# Patient Record
Sex: Female | Born: 1935 | Race: White | Hispanic: No | Marital: Married | Smoking: Never smoker
Health system: Southern US, Community
[De-identification: ages and names within clinical notes are randomized; demographics above are authoritative.]

## PROBLEM LIST (undated history)

## (undated) DIAGNOSIS — I517 Cardiomegaly: Secondary | ICD-10-CM

## (undated) DIAGNOSIS — R001 Bradycardia, unspecified: Secondary | ICD-10-CM

## (undated) DIAGNOSIS — Z9289 Personal history of other medical treatment: Secondary | ICD-10-CM

## (undated) DIAGNOSIS — I4892 Unspecified atrial flutter: Secondary | ICD-10-CM

## (undated) DIAGNOSIS — I442 Atrioventricular block, complete: Secondary | ICD-10-CM

## (undated) HISTORY — DX: Unspecified atrial flutter: I48.92

## (undated) HISTORY — DX: Bradycardia, unspecified: R00.1

## (undated) HISTORY — DX: Personal history of other medical treatment: Z92.89

## (undated) HISTORY — DX: Atrioventricular block, complete: I44.2

## (undated) HISTORY — DX: Cardiomegaly: I51.7

---

## 1992-02-07 HISTORY — PX: CHOLECYSTECTOMY: SHX55

## 1997-07-28 ENCOUNTER — Other Ambulatory Visit: Admission: RE | Admit: 1997-07-28 | Discharge: 1997-07-28 | Payer: Self-pay | Admitting: Obstetrics and Gynecology

## 1999-03-04 ENCOUNTER — Other Ambulatory Visit: Admission: RE | Admit: 1999-03-04 | Discharge: 1999-03-04 | Payer: Self-pay | Admitting: Obstetrics and Gynecology

## 2000-04-27 ENCOUNTER — Other Ambulatory Visit: Admission: RE | Admit: 2000-04-27 | Discharge: 2000-04-27 | Payer: Self-pay | Admitting: Obstetrics and Gynecology

## 2001-06-03 ENCOUNTER — Other Ambulatory Visit: Admission: RE | Admit: 2001-06-03 | Discharge: 2001-06-03 | Payer: Self-pay | Admitting: Obstetrics and Gynecology

## 2003-06-11 ENCOUNTER — Other Ambulatory Visit: Admission: RE | Admit: 2003-06-11 | Discharge: 2003-06-11 | Payer: Self-pay | Admitting: Obstetrics and Gynecology

## 2008-02-07 DIAGNOSIS — I442 Atrioventricular block, complete: Secondary | ICD-10-CM

## 2008-02-07 DIAGNOSIS — I4892 Unspecified atrial flutter: Secondary | ICD-10-CM

## 2008-02-07 DIAGNOSIS — R001 Bradycardia, unspecified: Secondary | ICD-10-CM

## 2008-02-07 HISTORY — DX: Bradycardia, unspecified: R00.1

## 2008-02-07 HISTORY — DX: Unspecified atrial flutter: I48.92

## 2008-02-07 HISTORY — DX: Atrioventricular block, complete: I44.2

## 2008-06-13 ENCOUNTER — Inpatient Hospital Stay (HOSPITAL_COMMUNITY): Admission: EM | Admit: 2008-06-13 | Discharge: 2008-06-17 | Payer: Self-pay | Admitting: Emergency Medicine

## 2008-06-13 ENCOUNTER — Encounter (INDEPENDENT_AMBULATORY_CARE_PROVIDER_SITE_OTHER): Payer: Self-pay | Admitting: Cardiology

## 2008-06-16 HISTORY — PX: PACEMAKER PLACEMENT: SHX43

## 2009-09-29 ENCOUNTER — Ambulatory Visit: Payer: Self-pay | Admitting: Cardiology

## 2010-01-08 ENCOUNTER — Encounter: Payer: Self-pay | Admitting: Internal Medicine

## 2010-03-08 NOTE — Miscellaneous (Signed)
Summary: Device preload  Clinical Lists Changes  Observations: Added new observation of PPM INDICATN: Mobitz II (01/08/2010 12:07) Added new observation of MAGNET RTE: BOL 85 ERI 65 (01/08/2010 12:07) Added new observation of PPMLEADSTAT2: active (01/08/2010 12:07) Added new observation of PPMLEADSER2: 098119  (01/08/2010 12:07) Added new observation of PPMLEADMOD2: 4470  (01/08/2010 12:07) Added new observation of PPMLEADDOI2: 06/16/2008  (01/08/2010 12:07) Added new observation of PPMLEADLOC2: RV  (01/08/2010 12:07) Added new observation of PPMLEADSTAT1: active  (01/08/2010 12:07) Added new observation of PPMLEADSER1: 147829  (01/08/2010 12:07) Added new observation of PPMLEADMOD1: 4469  (01/08/2010 12:07) Added new observation of PPMLEADDOI1: 06/16/2008  (01/08/2010 12:07) Added new observation of PPMLEADLOC1: RA  (01/08/2010 12:07) Added new observation of PPM DOI: 06/16/2008  (01/08/2010 12:07) Added new observation of PPM SERL#: [id number]  (01/08/2010 12:07) Added new observation of PPM MODL#: [id number]  (01/08/2010 12:07) Added new observation of PACEMAKERMFG: Medtronic  (01/08/2010 12:07) Added new observation of PPM IMP MD: Duffy Rhody Tennant,MD  (01/08/2010 12:07) Added new observation of PPM REFER MD: Rolla Plate  (01/08/2010 12:07) Added new observation of PACEMAKER MD: Hillis Range, MD  (01/08/2010 12:07)      PPM Specifications Following MD:  Hillis Range, MD     Referring MD:  Rolla Plate PPM Vendor:  Medtronic     PPM Model Number:  [id number]     PPM Serial Number:  [id number] PPM DOI:  06/16/2008     PPM Implanting MD:  Rolla Plate  Lead 1    Location: RA     DOI: 06/16/2008     Model #: [id number]     Serial #: [id number]     Status: active Lead 2    Location: RV     DOI: 06/16/2008     Model #: [id number]     Serial #: [id number]     Status: active  Magnet Response Rate:  BOL 85 ERI 65  Indications:  Mobitz II

## 2010-05-17 ENCOUNTER — Encounter: Payer: Self-pay | Admitting: Internal Medicine

## 2010-05-17 LAB — BASIC METABOLIC PANEL
BUN: 11 mg/dL (ref 6–23)
BUN: 12 mg/dL (ref 6–23)
CO2: 25 mEq/L (ref 19–32)
Calcium: 8.9 mg/dL (ref 8.4–10.5)
Chloride: 106 mEq/L (ref 96–112)
Creatinine, Ser: 0.9 mg/dL (ref 0.4–1.2)
Creatinine, Ser: 0.91 mg/dL (ref 0.4–1.2)
GFR calc non Af Amer: 59 mL/min — ABNORMAL LOW (ref >60)
Glucose, Bld: 108 mg/dL — ABNORMAL HIGH (ref 70–99)
Potassium: 3.8 mEq/L (ref 3.5–5.1)

## 2010-05-17 LAB — APTT: aPTT: 27 seconds (ref 24–37)

## 2010-05-17 LAB — CBC
HCT: 42 % (ref 36.0–46.0)
Hemoglobin: 14.6 g/dL (ref 12.0–15.0)
MCHC: 34.9 g/dL (ref 30.0–36.0)
MCV: 91.1 fL (ref 78.0–100.0)
MCV: 91.7 fL (ref 78.0–100.0)
Platelets: 115 10*3/uL — ABNORMAL LOW (ref 150–400)
RBC: 4.6 MIL/uL (ref 3.87–5.11)
WBC: 5.8 10*3/uL (ref 4.0–10.5)

## 2010-05-17 LAB — POCT I-STAT, CHEM 8
Creatinine, Ser: 1.1 mg/dL (ref 0.4–1.2)
Glucose, Bld: 93 mg/dL (ref 70–99)
Hemoglobin: 15 g/dL (ref 12.0–15.0)
Potassium: 3.8 mEq/L (ref 3.5–5.1)
TCO2: 25 mmol/L (ref 0–100)

## 2010-05-17 LAB — CARDIAC PANEL(CRET KIN+CKTOT+MB+TROPI)
CK, MB: 3.1 ng/mL (ref 0.3–4.0)
Total CK: 83 U/L (ref 7–177)
Troponin I: 0.19 ng/mL — ABNORMAL HIGH (ref 0.00–0.06)

## 2010-05-17 LAB — DIFFERENTIAL
Eosinophils Absolute: 0.1 10*3/uL (ref 0.0–0.7)
Eosinophils Relative: 1 % (ref 0–5)
Lymphs Abs: 1.6 10*3/uL (ref 0.7–4.0)
Monocytes Absolute: 0.5 10*3/uL (ref 0.1–1.0)
Monocytes Relative: 8 % (ref 3–12)

## 2010-05-17 LAB — CK TOTAL AND CKMB (NOT AT ARMC): Relative Index: 1.9 (ref 0.0–2.5)

## 2010-05-17 LAB — POCT CARDIAC MARKERS
CKMB, poc: <1 ng/mL — ABNORMAL LOW (ref 1.0–8.0)
Troponin i, poc: <0.05 ng/mL (ref 0.00–0.09)

## 2010-05-17 LAB — SAMPLE TO BLOOD BANK

## 2010-05-17 LAB — PROTIME-INR: Prothrombin Time: 15.4 seconds — ABNORMAL HIGH (ref 11.6–15.2)

## 2010-05-23 ENCOUNTER — Ambulatory Visit (INDEPENDENT_AMBULATORY_CARE_PROVIDER_SITE_OTHER): Payer: Medicare Other | Admitting: Internal Medicine

## 2010-05-23 ENCOUNTER — Encounter: Payer: Self-pay | Admitting: Internal Medicine

## 2010-05-23 DIAGNOSIS — I10 Essential (primary) hypertension: Secondary | ICD-10-CM | POA: Insufficient documentation

## 2010-05-23 DIAGNOSIS — I442 Atrioventricular block, complete: Secondary | ICD-10-CM | POA: Insufficient documentation

## 2010-05-23 DIAGNOSIS — I441 Atrioventricular block, second degree: Secondary | ICD-10-CM

## 2010-05-23 DIAGNOSIS — I4891 Unspecified atrial fibrillation: Secondary | ICD-10-CM | POA: Insufficient documentation

## 2010-05-23 NOTE — Progress Notes (Signed)
Toni Sanders is a pleasant 77 y.o. yo patient with a h/o bradycardia sp PPM (MDT) by Dr Deborah Chalk 06/16/08 who presents today to establish care in the Electrophysiology device clinic.  Per report, she presented with epistaxis and atrial flutter in 2010.  She was observed to have Mobitz II second degree AV block for which she underwent PPM. The patient reports doing very well since having a pacemaker implanted and remains very active despite her age. She does her own housework and remains active.  Today, she  denies symptoms of palpitations, chest pain, shortness of breath, orthopnea, PND, lower extremity edema, dizziness, presyncope, syncope, or neurologic sequela.  The patientis tolerating medications without difficulties and is otherwise without complaint today.   Past Medical History  Diagnosis Date  . Atrial flutter 2010  . Bradycardia 2010  . Complete heart block 2010    s/p PPM by Dr Deborah Chalk (MDT) 06/16/08  . Osteoporosis     Past Surgical History  Procedure Date  . Pacemaker placement 06/16/08    Implantation of dual-chamber Medtronic PPMY by Dr Deborah Chalk    History   Social History  . Marital Status: Married    Spouse Name: N/A    Number of Children: N/A  . Years of Education: N/A   Occupational History  . Not on file.   Social History Main Topics  . Smoking status: Never Smoker   . Smokeless tobacco: Never Used  . Alcohol Use: No  . Drug Use: No  . Sexually Active: Not on file   Other Topics Concern  . Not on file   Social History Narrative   Lives with spouse in [place].    Family History  Problem Relation Age of Onset  . Hypertension      Allergies  Allergen Reactions  . Morphine And Related Nausea And Vomiting    Current outpatient prescriptions:aspirin 81 MG tablet, 1 tab when pt remebers , Disp: , Rfl: ;  fish oil-omega-3 fatty acids 1000 MG capsule, 1 tab po qd , Disp: , Rfl: ;  metoprolol (TOPROL-XL) 50 MG 24 hr tablet, Take 50 mg by mouth daily.  ,  Disp: , Rfl:   ROS- all systems are reviewed and negative except as per HPI  Physical Exam: Filed Vitals:   05/23/10 1128  BP: 152/68  Pulse: 68  Resp: 14  Height: 5' (1.524 m)  Weight: 142 lb (64.411 kg)    GEN- The patient is well appearing, alert and oriented x 3 today.   Head- normocephalic, atraumatic Eyes-  Sclera clear, conjunctiva pink Ears- hearing intact Oropharynx- clear Neck- supple, no JVP Lymph- no cervical lymphadenopathy Lungs- Clear to ausculation bilaterally, normal work of breathing Chest- pacemaker pocket is well healed Heart- Regular rate and rhythm, no murmurs, rubs or gallops, PMI not laterally displaced GI- soft, NT, ND, + BS Extremities- no clubbing, cyanosis, or edema MS- no significant deformity or atrophy Skin- no rash or lesion Psych- euthymic mood, full affect Neuro- strength and sensation are intact

## 2010-05-23 NOTE — Assessment & Plan Note (Signed)
Normal pacemaker function See Pace Art report No changes today  

## 2010-05-23 NOTE — Assessment & Plan Note (Signed)
BP above goal today Salt restriction advised No medicine changes

## 2010-05-23 NOTE — Patient Instructions (Signed)
Your physician recommends that you schedule a follow-up appointment in: YEAR WITH DR [facility] Your physician recommends that you continue on your current medications as directed. Please refer to the Current Medication list given to you today.

## 2010-06-21 NOTE — Cardiovascular Report (Signed)
[name], Toni Sanders NO.:  [id number]   MEDICAL RECORD NO.:  [id number]          PATIENT TYPE:  INP   LOCATION:  2017                         FACILITY:  [facility]   PHYSICIAN:  Colleen Can. Deborah Chalk, M.D.DATE OF BIRTH:  1922/12/06   DATE OF PROCEDURE:  06/16/2008  DATE OF DISCHARGE:                            CARDIAC CATHETERIZATION   PROCEDURE:  Implantation of dual-chamber pulse generator under  fluoroscopy.   INDICATIONS FOR PROCEDURE:  Second-degree arteriovenous block, but also  associated atrial flutter with profound bradycardia.   PROCEDURE:  The right subclavicular area was prepped and draped.  The  area was infiltrated with 1% Xylocaine.  Subcutaneous pocket was created  to the prepectoral fascia.  Two punctures were made into the subclavian  vein over top of the first rib.  Using 7-French [facility] introducers, the  atrial and ventricular leads were introduced.  The ventricular lead was  a Guidant model [id number], serial number [id number], 52-cm lead was placed the  right ventricular apex.  The R waves measured 8.6 mV.  Right ventricular  lead impedance was 690 ohms.  The right ventricular threshold was 0.4 V  at 0.5 msec pulse width with a current of 0.5 MA.  There was no  diaphragmatic pacing at 10 V.   The atrial lead was introduced with 7-French [facility] introducer.  The lead  was a Guidant model [id number], serial number [id number], 45-cm lead was placed in  the right atrium.  P-waves measured 3.3 mV.  The right atrial lead  impedance was 476 ohms.  Right atrial threshold was 0.4 V at 0.5 msec  with a current of 1.5 MA.  The leads were sutured in place.  The wound  was flushed with gentamicin solution.  The leads were connected to  Medtronic ENRHYTHM model 31501DR, serial number PNP [id number].  The unit  was sutured in place.  The wound was then closed with 2-0 and  subsequently 4-0 Vicryl.  Steri-Strips were applied.  The patient  tolerated the procedure well.      Colleen Can.  Deborah Chalk, M.D.  Electronically Signed     SNT/MEDQ  D:  06/16/2008  T:  06/17/2008  Job:  [id number]   cc:   Colleen Can. Deborah Chalk, M.D.

## 2010-06-21 NOTE — Discharge Summary (Signed)
Toni Sanders, Toni Sanders NO.:  [id number]   MEDICAL RECORD NO.:  [id number]          PATIENT TYPE:  INP   LOCATION:  2017                         FACILITY:  [facility]   PHYSICIAN:  Colleen Can. Deborah Chalk, M.D.DATE OF BIRTH:  July 12, 1922   DATE OF ADMISSION:  06/13/2008  DATE OF DISCHARGE:  06/17/2008                               DISCHARGE SUMMARY   DISCHARGE DIAGNOSES:  1. Third-degree arteriovenous block with subsequent implantation of a      Medtronic EnRhythm P1501DR, serial number [id number].  2. Transient atrial fibrillation/flutter.  3. Hypertension, now started on beta-blocker therapy.  4. History of seasonal allergies.   HISTORY OF PRESENT ILLNESS:  The patient is an 77 year old white female  who has really had no significant cardiac history.  She has had  borderline hypertension and has not been on medicines.  She presented to  the hospital with recurrent epistaxis.  In the emergency room, she was  found to be significantly hypertensive as well as profoundly  bradycardic.  She was noted to be in complete heart block.  She was  subsequently seen and admitted for further evaluation.   Please see the history and physical for further patient presentation and  profile.   LABORATORY DATA ON ADMISSION:  Her chest x-ray showed cardiomegaly  without acute disease.  CBC showed hemoglobin of 14, hematocrit 42,  white count was 5, and platelets 150.  Chemistries were normal except  for glucose of 116.  Coags were normal.  Cardiac enzymes were basically  negative.  She had a mild elevation in the troponin at 0.34 and 0.19.  Her CK-MBs were all negative.  Her TSH was normal.   HOSPITAL COURSE:  The patient was admitted to the coronary care unit.  External pacer pads were placed.  She was totally asymptomatic from her  rhythm standpoint.  Her blood pressure was treated with IV  nitroglycerin.  She did develop transient atrial fibrillation/flutter on  Jun 16, 2008.  We were  able to proceed on with pacemaker implantation  that following day.  The procedure was tolerated well without any no  known complications.  An EnRhythm P1501DR, serial number [id number] was  placed.  An overall satisfactory result was obtained.  Today, on Jun 17, 2008, she is doing well without complaints.  Her pacemaker site is  satisfactory.  Her telemetry is satisfactory.  Her blood pressure  remains elevated and we will go ahead and start low-dose beta-blocker  therapy.  She does have some worsening aeration on her chest x-ray in  the left base, this will need to be repeated as an outpatient.  From our  standpoint, she is felt to be stable for discharge.   DISCHARGE CONDITION:  Stable.   DISCHARGE DIET:  Low-salt, heart-healthy.   DISCHARGE MEDICINES:  1. Fish oil tablet daily as she was taking before.  2. We will add baby aspirin daily and Toprol XL 50 mg a day.  3. She can take Tylenol for pain.   Extensive written instructions are given regarding pacemaker care,  specifically not to raise her right arm above  her head for the next 2-3  weeks as well as to avoid getting the wound wet for the next 5 days.  We  will plan on seeing her back in the office in 1 week, certainly sooner  if any problems arise in the interim.   Greater than 30 minutes spent for discharge.      [name], N.P.      Colleen Can. Deborah Chalk, M.D.  Electronically Signed    LC/MEDQ  D:  06/17/2008  T:  06/17/2008  Job:  [id number]   cc:   Geoffry Paradise, M.D.

## 2010-08-18 ENCOUNTER — Encounter: Payer: Medicare Other | Admitting: *Deleted

## 2010-08-25 ENCOUNTER — Encounter: Payer: Self-pay | Admitting: *Deleted

## 2010-08-30 ENCOUNTER — Telehealth: Payer: Self-pay | Admitting: Internal Medicine

## 2010-08-30 NOTE — Telephone Encounter (Signed)
All Cardiac Records faxed to [facility], ROI signed by PT  08/30/10/km

## 2010-09-22 DIAGNOSIS — I517 Cardiomegaly: Secondary | ICD-10-CM

## 2010-09-22 DIAGNOSIS — Z9289 Personal history of other medical treatment: Secondary | ICD-10-CM

## 2010-09-22 HISTORY — DX: Cardiomegaly: I51.7

## 2010-09-22 HISTORY — DX: Personal history of other medical treatment: Z92.89

## 2012-08-18 ENCOUNTER — Other Ambulatory Visit: Payer: Self-pay | Admitting: Cardiovascular Disease

## 2012-08-18 DIAGNOSIS — I441 Atrioventricular block, second degree: Secondary | ICD-10-CM

## 2012-09-05 ENCOUNTER — Other Ambulatory Visit: Payer: Self-pay | Admitting: Cardiovascular Disease

## 2012-09-05 ENCOUNTER — Telehealth: Payer: Self-pay | Admitting: *Deleted

## 2012-09-05 ENCOUNTER — Encounter (HOSPITAL_COMMUNITY): Payer: Self-pay | Admitting: Pharmacy Technician

## 2012-09-05 ENCOUNTER — Other Ambulatory Visit: Payer: Self-pay | Admitting: *Deleted

## 2012-09-05 DIAGNOSIS — Z45018 Encounter for adjustment and management of other part of cardiac pacemaker: Secondary | ICD-10-CM

## 2012-09-05 LAB — REMOTE PACEMAKER DEVICE
AL AMPLITUDE: 1.8 mv
BRDY-0002RA: 65 {beats}/min

## 2012-09-05 NOTE — Telephone Encounter (Signed)
EnRhythm pacemaker has reached ERI after 4 years of service. Patient made aware. No symptoms associated with VVI/65. Patient prefers to make an appointment with Dr.Croitoru prior to scheduling the procedure---scheduler notified. MDT rep will also be notified of the early batt depletion.

## 2012-09-05 NOTE — Telephone Encounter (Signed)
After further review of the patient's chart, I discovered that she is dependent and that we have had issues with her device before, so I explained to the patient that it would be more beneficial for her to have the procedure tomorrow. Patient voiced understanding and agreed.

## 2012-09-06 ENCOUNTER — Ambulatory Visit (HOSPITAL_COMMUNITY)
Admission: RE | Admit: 2012-09-06 | Discharge: 2012-09-06 | Disposition: A | Discharge status: Home / Self Care | Payer: Medicare Other | Source: Ambulatory Visit | Attending: Cardiovascular Disease | Admitting: Cardiovascular Disease

## 2012-09-06 ENCOUNTER — Encounter (HOSPITAL_COMMUNITY): Payer: Self-pay | Admitting: Cardiology

## 2012-09-06 ENCOUNTER — Encounter (HOSPITAL_COMMUNITY)
Admission: RE | Disposition: A | Discharge status: Home / Self Care | Payer: Self-pay | Source: Ambulatory Visit | Attending: Cardiovascular Disease

## 2012-09-06 ENCOUNTER — Encounter (HOSPITAL_COMMUNITY): Payer: Self-pay | Admitting: Pharmacy Technician

## 2012-09-06 DIAGNOSIS — C4490 Unspecified malignant neoplasm of skin, unspecified: Secondary | ICD-10-CM | POA: Diagnosis present

## 2012-09-06 DIAGNOSIS — I10 Essential (primary) hypertension: Secondary | ICD-10-CM | POA: Insufficient documentation

## 2012-09-06 DIAGNOSIS — I4891 Unspecified atrial fibrillation: Secondary | ICD-10-CM | POA: Diagnosis present

## 2012-09-06 DIAGNOSIS — Z45018 Encounter for adjustment and management of other part of cardiac pacemaker: Secondary | ICD-10-CM | POA: Insufficient documentation

## 2012-09-06 DIAGNOSIS — M810 Age-related osteoporosis without current pathological fracture: Secondary | ICD-10-CM | POA: Insufficient documentation

## 2012-09-06 DIAGNOSIS — Z884 Allergy status to anesthetic agent status: Secondary | ICD-10-CM | POA: Insufficient documentation

## 2012-09-06 DIAGNOSIS — Z950 Presence of cardiac pacemaker: Secondary | ICD-10-CM

## 2012-09-06 DIAGNOSIS — Z45010 Encounter for checking and testing of cardiac pacemaker pulse generator [battery]: Secondary | ICD-10-CM

## 2012-09-06 DIAGNOSIS — I4892 Unspecified atrial flutter: Secondary | ICD-10-CM | POA: Insufficient documentation

## 2012-09-06 DIAGNOSIS — I442 Atrioventricular block, complete: Secondary | ICD-10-CM

## 2012-09-06 DIAGNOSIS — Z885 Allergy status to narcotic agent status: Secondary | ICD-10-CM | POA: Insufficient documentation

## 2012-09-06 DIAGNOSIS — Z85828 Personal history of other malignant neoplasm of skin: Secondary | ICD-10-CM | POA: Insufficient documentation

## 2012-09-06 HISTORY — PX: PACEMAKER GENERATOR CHANGE: SHX5481

## 2012-09-06 HISTORY — PX: PACEMAKER GENERATOR CHANGE: SHX5998

## 2012-09-06 LAB — CBC
MCH: 32.2 pg (ref 26.0–34.0)
MCV: 92.4 fL (ref 78.0–100.0)
Platelets: 140 10*3/uL — ABNORMAL LOW (ref 150–400)
RDW: 13.2 % (ref 11.5–15.5)

## 2012-09-06 LAB — BASIC METABOLIC PANEL
CO2: 25 mEq/L (ref 19–32)
Calcium: 9.3 mg/dL (ref 8.4–10.5)
Creatinine, Ser: 0.93 mg/dL (ref 0.50–1.10)
GFR calc Af Amer: 62 mL/min — ABNORMAL LOW (ref >90)

## 2012-09-06 LAB — SURGICAL PCR SCREEN
MRSA, PCR: NEGATIVE
Staphylococcus aureus: NEGATIVE

## 2012-09-06 LAB — PROTIME-INR: Prothrombin Time: 12.8 seconds (ref 11.6–15.2)

## 2012-09-06 SURGERY — PACEMAKER GENERATOR CHANGE
Anesthesia: LOCAL

## 2012-09-06 MED ORDER — MIDAZOLAM HCL 5 MG/5ML IJ SOLN
INTRAMUSCULAR | Status: AC
  Filled 2012-09-06: qty 5

## 2012-09-06 MED ORDER — CEFAZOLIN SODIUM-DEXTROSE 2-3 GM-% IV SOLR
2.0000 g | INTRAVENOUS | Status: DC
  Filled 2012-09-06: qty 50

## 2012-09-06 MED ORDER — SODIUM CHLORIDE 0.9 % IV SOLN
INTRAVENOUS | Status: DC
  Administered 2012-09-06: 08:00:00 via INTRAVENOUS

## 2012-09-06 MED ORDER — ONDANSETRON HCL 4 MG/2ML IJ SOLN: 4.0000 mg | Freq: Four times a day (QID) | INTRAMUSCULAR | Status: DC | PRN

## 2012-09-06 MED ORDER — CHLORHEXIDINE GLUCONATE 4 % EX LIQD
60.0000 mL | Freq: Once | CUTANEOUS | Status: DC
  Filled 2012-09-06: qty 60

## 2012-09-06 MED ORDER — SODIUM CHLORIDE 0.9 % IJ SOLN: 3.0000 mL | INTRAMUSCULAR | Status: DC | PRN

## 2012-09-06 MED ORDER — ACETAMINOPHEN 325 MG PO TABS: 325.0000 mg | ORAL_TABLET | ORAL | Status: DC | PRN

## 2012-09-06 MED ORDER — ONDANSETRON HCL 4 MG/2ML IJ SOLN
INTRAMUSCULAR | Status: AC
  Filled 2012-09-06: qty 2

## 2012-09-06 MED ORDER — GENTAMICIN SULFATE 40 MG/ML IJ SOLN
80.0000 mg | INTRAMUSCULAR | Status: DC
  Filled 2012-09-06: qty 2

## 2012-09-06 MED ORDER — LIDOCAINE HCL (PF) 1 % IJ SOLN
INTRAMUSCULAR | Status: AC
  Filled 2012-09-06: qty 60

## 2012-09-06 MED ORDER — SODIUM CHLORIDE 0.9 % IV SOLN: INTRAVENOUS | Status: DC

## 2012-09-06 MED ORDER — MUPIROCIN 2 % EX OINT
TOPICAL_OINTMENT | Freq: Two times a day (BID) | CUTANEOUS | Status: DC
  Administered 2012-09-06: 1 via NASAL
  Filled 2012-09-06 (×2): qty 22

## 2012-09-06 MED ORDER — FENTANYL CITRATE 0.05 MG/ML IJ SOLN
INTRAMUSCULAR | Status: AC
  Filled 2012-09-06: qty 2

## 2012-09-06 NOTE — Op Note (Signed)
Procedure report  Procedure performed:  1. Dual chamber pacemaker generator changeout  2. Light sedation  Reason for procedure:  1. Device generator at elective replacement interval  - early battery depletion .Complete heart block, pacemaker dependent Procedure performed by:  Thurmon Fair, MD  Complications:  None  Estimated blood loss:  <5 mL  Medications administered during procedure:  Ancef 2 g intravenously, lidocaine 1% 30 mL locally, fentanyl 25 mcg intravenously, Versed 1 mg intravenously Device details:   New Generator Medtronic Adapta L model number ADDRL1, serial number [id number] Right atrial lead (chronic) Guidant [id number], serial number [id number] (implanted 06/16/2008) Right ventricular lead (chronic)  Guidant 4470, serial number [id number] (implanted 06/16/2008)  Explanted generator Medtronic EnRhythm,  model number [id number], serial number   (implanted 06/16/2008)  Procedure details:  After the risks and benefits of the procedure were discussed the patient provided informed consent. She was brought to the cardiac catheter lab in the fasting state. The patient was prepped and draped in usual sterile fashion. Local anesthesia with 1% lidocaine was administered to to the left infraclavicular area. A 5-6cm horizontal incision was made parallel with and 2-3 cm caudal to the right clavicle, in the area of an old scar. Using minimal electrocautery and mostly sharp and blunt dissection the prepectoral pocket was opened carefully to avoid injury to the loops of chronic leads. Extensive dissection was not necessary. The device was explanted. The pocket was carefully inspected for hemostasis and flushed with copious amounts of antibiotic solution.  The leads were disconnected from the old generator and testing of the lead parameters showed excellent values. The new generator was connected to the chronic leads, with appropriate pacing noted.   The entire system was then carefully inserted in the  pocket with care been taking that the leads and device assumed a comfortable position without pressure on the incision. Great care was taken that the leads be located deep to the generator. The pocket was then closed in layers using 2 layers of 2-0 Vicryl and cutaneous staples after which a sterile dressing was applied.   At the end of the procedure the following lead parameters were encountered:   Right atrial lead sensed P waves 3.6 mV, impedance 542 ohms, threshold 0.7 at 0.4 ms pulse width.  Right ventricular lead sensed R waves  None detected, impedance 448 ohms, threshold 0.5 at 0.4 ms pulse width. Thurmon Fair, MD, [facility] [phone] office [phone] pager

## 2012-09-06 NOTE — H&P (Signed)
Patient ID: Toni Sanders MRN: [id number], DOB/AGE: 1922/02/24    Admit date: 09/06/2012     Primary Physician: Minda Meo, MD Primary Cardiologist: Dr Royann Shivers   HPI: 79 y/o followed by Dr Royann Shivers and Dr Bryn Gulling with a history of prior pacemaker implant by Dr Deborah Chalk in May 2010 for HB. She is pace dependent. Recent remote check indicated she was at end of battery life and she is admitted now for generator change. She is asymptomatic, no syncope, weakness, or unusual dyspnea.  Problem List: Past Medical History   Diagnosis  Date   .  Atrial flutter  2010   .  Bradycardia  2010   .  Complete heart block  2010       s/p PPM by Dr Deborah Chalk (MDT) 06/16/08   .  Osteoporosis      Past Surgical History   Procedure  Laterality  Date   .  Pacemaker placement    06/16/08       Implantation of dual-chamber Medtronic PPMY by Dr Deborah Chalk   .  Cholecystectomy    1994     Allergies:  Allergies   Allergen  Reactions   .  Anesthetics, Amide         "hard to come out of"   .  Anesthetics, Ester     .  Anesthetics, Halogenated     .  Morphine And Related  Nausea And Vomiting     Home Medications Current Facility-Administered Medications   Medication  Dose  Route  Frequency  Provider  Last Rate  Last Dose   .  0.9 %  sodium chloride infusion     Intravenous  Continuous  Marynell Bies, MD         .  ceFAZolin (ANCEF) IVPB 2 g/50 mL premix   2 g  Intravenous  On Call  Kimbrely Buckel, MD         .  chlorhexidine (HIBICLENS) 4 % liquid 4 application   60 mL  Topical  Once  Chikita Dogan, MD         .  gentamicin (GARAMYCIN) 80 mg in sodium chloride irrigation 0.9 % 500 mL irrigation   80 mg  Irrigation  On Call  Thurmon Fair, MD         .  mupirocin ointment (BACTROBAN) 2 %     Nasal  BID  Thurmon Fair, MD     1 application at 09/06/12 0754   .  sodium chloride 0.9 % injection 3 mL   3 mL  Intravenous  PRN  Thurmon Fair, MD            Family History   Problem  Relation  Age  of Onset   .  Hypertension          History      Social History   .  Marital Status:  Married       Spouse Name:  N/A       Number of Children:  N/A   .  Years of Education:  N/A       Occupational History   .  Not on file.       Social History Main Topics   .  Smoking status:  Never Smoker    .  Smokeless tobacco:  Never Used   .  Alcohol Use:  No   .  Drug Use:  No   .  Sexually Active:  Not on file  Other Topics  Concern   .  Not on file       Social History Narrative     Lives with spouse in [place].    Review of Systems: General: negative for chills, fever, night sweats or weight changes.   Cardiovascular: negative for chest pain, dyspnea on exertion, edema, orthopnea, palpitations, paroxysmal nocturnal dyspnea or shortness of breath. Low risk Myoview 2012. Nl LVF by echo 2012. Dermatological: negative for rash. 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 She had recent skin biopsy on her nose. Respiratory: negative for cough or wheezing Urologic: negative for hematuria Abdominal: negative for nausea, vomiting, diarrhea, bright red blood per rectum, melena, or hematemesis Neurologic: negative for visual changes, syncope, or dizziness. No history of CVA All other systems reviewed and are otherwise negative except as noted above.   Physical Exam: Blood pressure 177/70, temperature 98.1 F (36.7 C), temperature source Oral, resp. rate 18, height 5\' 2"  (1.575 m), weight 137 lb (62.143 kg), SpO2 99.00%.  General appearance: alert, cooperative, appears stated age and no distress Recent skin biopsy on nose Neck: no carotid bruit and no JVD Lungs: clear to auscultation bilaterally Heart: regular rate and rhythm Abdomen: soft, non-tender; bowel sounds normal; no masses,  no organomegaly Extremities: extremities normal, atraumatic, no cyanosis or edema Pulses: 2+ and symmetric Skin: Skin color, texture, turgor normal. No rashes or  lesions or recent skin biopsy on her nose Neurologic: Grossly normal  Labs:  No results found for this or any previous visit (from the past 24 hour(s)).   Radiology/Studies: No results found.   ZHY:QMVHQ   ASSESSMENT AND PLAN:   Principal Problem:   Pacemaker battery depletion Active Problems:   Atrial flutter   Complete heart block-MDT PTVDP 5/10. Pacer dependent   Hypertension   Skin cancer- squamous cell, basal cell  PLAN: Elective generator change.  Unexpected early battery depletion in a pacemaker generator with known advisory and with a previous unexplained software reset a few months ago. The device function is not reliable and the patient has complete heart block. Plan generator changeout today. Suspect lead function is normal and lead revision is unlikely to be needed.   This procedure has been fully reviewed with the patient and written informed consent has been obtained.   Thurmon Fair, MD, [facility] [phone] 09/06/2012, 8:28 AM

## 2012-09-06 NOTE — Progress Notes (Signed)
Patient ID: Toni Sanders MRN: [id number], DOB/AGE: 1922-04-07   Admit date: 09/06/2012   Primary Physician: Minda Meo, MD Primary Cardiologist: Dr Royann Shivers  HPI: 79 y/o followed by Dr Royann Shivers and Dr Bryn Gulling with a history of prior pacemaker implant by Dr Deborah Chalk in May 2010 for HB. She is pace dependent. Recent remote check indicated she was at end of battery life and she is admitted now for generator change. She is asymptomatic, no syncope, weakness, or unusual dyspnea.   Problem List: Past Medical History  Diagnosis Date  . Atrial flutter 2010  . Bradycardia 2010  . Complete heart block 2010    s/p PPM by Dr Deborah Chalk (MDT) 06/16/08  . Osteoporosis     Past Surgical History  Procedure Laterality Date  . Pacemaker placement  06/16/08    Implantation of dual-chamber Medtronic PPMY by Dr Deborah Chalk  . Cholecystectomy  1994     Allergies:  Allergies  Allergen Reactions  . Anesthetics, Amide     "hard to come out of"  . Anesthetics, Ester   . Anesthetics, Halogenated   . Morphine And Related Nausea And Vomiting     Home Medications Current Facility-Administered Medications  Medication Dose Route Frequency Provider Last Rate Last Dose  . 0.9 %  sodium chloride infusion   Intravenous Continuous Mihai Croitoru, MD      . ceFAZolin (ANCEF) IVPB 2 g/50 mL premix  2 g Intravenous On Call Mihai Croitoru, MD      . chlorhexidine (HIBICLENS) 4 % liquid 4 application  60 mL Topical Once Mihai Croitoru, MD      . gentamicin (GARAMYCIN) 80 mg in sodium chloride irrigation 0.9 % 500 mL irrigation  80 mg Irrigation On Call Thurmon Fair, MD      . mupirocin ointment (BACTROBAN) 2 %   Nasal BID Thurmon Fair, MD   1 application at 09/06/12 0754  . sodium chloride 0.9 % injection 3 mL  3 mL Intravenous PRN Thurmon Fair, MD         Family History  Problem Relation Age of Onset  . Hypertension       History   Social History  . Marital Status: Married    Spouse Name: N/A     Number of Children: N/A  . Years of Education: N/A   Occupational History  . Not on file.   Social History Main Topics  . Smoking status: Never Smoker   . Smokeless tobacco: Never Used  . Alcohol Use: No  . Drug Use: No  . Sexually Active: Not on file   Other Topics Concern  . Not on file   Social History Narrative   Lives with spouse in [place].     Review of Systems: General: negative for chills, fever, night sweats or weight changes.  Cardiovascular: negative for chest pain, dyspnea on exertion, edema, orthopnea, palpitations, paroxysmal nocturnal dyspnea or shortness of breath. Low risk Myoview 2012. Nl LVF by echo 2012. Dermatological: negative for rash. 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 She had recent skin biopsy on her nose. Respiratory: negative for cough or wheezing Urologic: negative for hematuria Abdominal: negative for nausea, vomiting, diarrhea, bright red blood per rectum, melena, or hematemesis Neurologic: negative for visual changes, syncope, or dizziness. No history of CVA All other systems reviewed and are otherwise negative except as noted above.  Physical Exam: Blood pressure 177/70, temperature 98.1 F (36.7 C), temperature source Oral, resp. rate 18, height 5\' 2"  (  1.575 m), weight 137 lb (62.143 kg), SpO2 99.00%.  General appearance: alert, cooperative, appears stated age and no distress Neck: no carotid bruit and no JVD Lungs: clear to auscultation bilaterally Heart: regular rate and rhythm Abdomen: soft, non-tender; bowel sounds normal; no masses,  no organomegaly Extremities: extremities normal, atraumatic, no cyanosis or edema Pulses: 2+ and symmetric Skin: Skin color, texture, turgor normal. No rashes or lesions or recent skin biopsy on her nose Neurologic: Grossly normal    Labs:  No results found for this or any previous visit (from the past 24 hour(s)).   Radiology/Studies: No results  found.  ZOX:WRUEA  ASSESSMENT AND PLAN:  Principal Problem:   Pacemaker battery depletion Active Problems:   Atrial flutter   Complete heart block-MDT PTVDP 5/10. Pacer dependent   Hypertension   Skin cancer- squamous cell, basal cell   PLAN: Elective generator change.   Deland Pretty, PA-C 09/06/2012, 8:10 AM

## 2012-09-13 ENCOUNTER — Ambulatory Visit (INDEPENDENT_AMBULATORY_CARE_PROVIDER_SITE_OTHER): Payer: Medicare Other | Admitting: Cardiology

## 2012-09-13 ENCOUNTER — Encounter: Payer: Self-pay | Admitting: Cardiology

## 2012-09-13 VITALS — BP 158/82 | HR 88 | Ht 62.0 in | Wt 140.0 lb

## 2012-09-13 DIAGNOSIS — Z950 Presence of cardiac pacemaker: Secondary | ICD-10-CM | POA: Insufficient documentation

## 2012-09-13 NOTE — Progress Notes (Signed)
       09/13/2012   PCP: Minda Meo, MD   Chief Complaint  Patient presents with  . Follow-up    post hosp gener.chge out ,no chest pain,no sob ,no edema    Primary Cardiologist: Dr. Royann Shivers  HPI: 79 y/o followed by Dr Royann Shivers and Dr Bryn Gulling with a history of prior pacemaker implant by Dr Deborah Chalk in May 2010 for HB. She is pace dependent. Recent remote check indicated she was at end of battery life and she was brought in for generator change. She was asymptomatic, no syncope, weakness, or unusual dyspnea.  Old generator was explanted a new Medtronic Adapta L was implanted without complications.    She is here today for site check and staple removal. The site was well approximated and healed staples removed after cleaning the site with Betadine and cleaning again after removal. Steri-Strip was applied. Patient has no complaints she's done quite well since the procedure she is refraining from swimming in her pool for 2 more weeks.  She'll take the Steri-Strip off in 2 days.     Allergies  Allergen Reactions  . Anesthetics, Amide     "hard to come out of"  . Anesthetics, Ester   . Anesthetics, Halogenated   . Morphine And Related Nausea And Vomiting    Current Outpatient Prescriptions  Medication Sig Dispense Refill  . aspirin 81 MG tablet 1 tab when pt remebers       . fish oil-omega-3 fatty acids 1000 MG capsule Take 1 g by mouth daily. 1 tab po qd      . metoprolol (TOPROL-XL) 50 MG 24 hr tablet Take 50 mg by mouth daily.        No current facility-administered medications for this visit.    Past Medical History  Diagnosis Date  . Atrial flutter 2010  . Bradycardia 2010  . Complete heart block 2010    s/p PPM by Dr Deborah Chalk (MDT) 06/16/08  . Osteoporosis   . LVH (left ventricular hypertrophy) 09/22/10    Echo >55% mild LVH   . H/O cardiovascular stress test 09/22/10    no ischemia, EF >60%    Past Surgical History  Procedure Laterality Date  . Pacemaker  placement  06/16/08    Implantation of dual-chamber Medtronic PPMY by Dr Deborah Chalk  . Cholecystectomy  1994  . Pacemaker generator change  09/06/12    new medtronic generator Adapta L secondary to early battery depletion    ZOX:WRUEAVW:UJ colds or fevers, no weight changes Skin:no rashes or ulcers CV:see HPI PUL:see HPI   PHYSICAL EXAM BP 158/82  Pulse 88  Ht 5\' 2"  (1.575 m)  Wt 140 lb (63.504 kg)  BMI 25.6 kg/m2 General:Pleasant affect, NAD Skin:Warm and dry, brisk capillary refill Heart:S1S2 RRR without murmur, gallup, rub or click Lungs:clear without rales, rhonchi, or wheezes Neuro:alert and oriented, MAE, follows commands, + facial symmetry  ASSESSMENT AND PLAN S/P cardiac pacemaker procedure, gen change secondary to battery depletion, 09/06/12 to medtronic Adapta L Pacer site well approximated 3 staples removed Steri-Strips applied patient were removed Steri-Strip in 2 days. Site without erythema or drainage. She'll follow up with Dr. Royann Shivers in 4-5 weeks

## 2012-09-13 NOTE — Assessment & Plan Note (Signed)
Pacer site well approximated 3 staples removed Steri-Strips applied patient were removed Steri-Strip in 2 days. Site without erythema or drainage. She'll follow up with Dr. Royann Shivers in 4-5 weeks

## 2012-09-13 NOTE — Patient Instructions (Addendum)
No swimming in the pool for 2 weeks  Take off steri strip in 2 days  Follow up with Dr. Royann Shivers in 4-5 weeks  May shower and wet area in 2 days.

## 2012-10-03 ENCOUNTER — Ambulatory Visit (INDEPENDENT_AMBULATORY_CARE_PROVIDER_SITE_OTHER): Payer: Medicare Other | Admitting: Cardiovascular Disease

## 2012-10-03 ENCOUNTER — Encounter: Payer: Self-pay | Admitting: Cardiovascular Disease

## 2012-10-03 VITALS — BP 130/80 | HR 80 | Resp 16 | Ht 60.0 in | Wt 138.7 lb

## 2012-10-03 DIAGNOSIS — I10 Essential (primary) hypertension: Secondary | ICD-10-CM

## 2012-10-03 DIAGNOSIS — Z950 Presence of cardiac pacemaker: Secondary | ICD-10-CM

## 2012-10-03 DIAGNOSIS — I4892 Unspecified atrial flutter: Secondary | ICD-10-CM

## 2012-10-03 DIAGNOSIS — I472 Ventricular tachycardia: Secondary | ICD-10-CM

## 2012-10-03 DIAGNOSIS — I4729 Other ventricular tachycardia: Secondary | ICD-10-CM

## 2012-10-03 NOTE — Patient Instructions (Addendum)
Remote monitoring is used to monitor your Pacemaker of ICD from home. This monitoring reduces the number of office visits required to check your device to one time per year. It allows [place] to keep an eye on the functioning of your device to ensure it is working properly. You are scheduled for a device check from home on 11-19-2012 . You may send your transmission at any time that day. If you have a wireless device, the transmission will be sent automatically. After your physician reviews your transmission, you will receive a postcard with your next transmission date.  Your physician recommends that you schedule a follow-up appointment in: 1 year

## 2012-10-06 ENCOUNTER — Encounter: Payer: Self-pay | Admitting: Cardiovascular Disease

## 2012-10-06 DIAGNOSIS — I472 Ventricular tachycardia: Secondary | ICD-10-CM | POA: Insufficient documentation

## 2012-10-06 DIAGNOSIS — I4729 Other ventricular tachycardia: Secondary | ICD-10-CM | POA: Insufficient documentation

## 2012-10-06 NOTE — Assessment & Plan Note (Signed)
Good control

## 2012-10-06 NOTE — Progress Notes (Signed)
Patient ID: Toni Sanders, female   DOB: 02-17-1922, 79 y.o.   MRN: [id number]     Reason for office visit Pacemaker followup  Toni Sanders has done well since her generator change out. This was performed because her Medtronic N. rhythm device had early battery depletion. No cardiac events have occurred since that time. Device checks have been normal. Chest not been aware of palpitations and has not had syncope. 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 The pacemaker site has healed nicely.    Allergies  Allergen Reactions  . Anesthetics, Amide     "hard to come out of"  . Anesthetics, Ester   . Anesthetics, Halogenated   . Morphine And Related Nausea And Vomiting    Current Outpatient Prescriptions  Medication Sig Dispense Refill  . aspirin 81 MG tablet 1 tab when pt remebers       . fish oil-omega-3 fatty acids 1000 MG capsule Take 1 g by mouth daily. 1 tab po qd      . metoprolol (TOPROL-XL) 50 MG 24 hr tablet Take 50 mg by mouth daily.        No current facility-administered medications for this visit.    Past Medical History  Diagnosis Date  . Atrial flutter 2010  . Bradycardia 2010  . Complete heart block 2010    s/p PPM by Dr Deborah Chalk (MDT) 06/16/08  . Osteoporosis   . LVH (left ventricular hypertrophy) 09/22/10    Echo >55% mild LVH   . H/O cardiovascular stress test 09/22/10    no ischemia, EF >60%    Past Surgical History  Procedure Laterality Date  . Pacemaker placement  06/16/08    Implantation of dual-chamber Medtronic PPMY by Dr Deborah Chalk  . Cholecystectomy  1994  . Pacemaker generator change  09/06/12    new medtronic generator Adapta L secondary to early battery depletion    Family History  Problem Relation Age of Onset  . Hypertension    . Cancer - Prostate Father   . Cancer - Ovarian Maternal Grandmother   . Pneumonia Paternal Grandmother     History   Social History  . Marital Status: Married    Spouse Name: N/A   Number of Children: N/A  . Years of Education: N/A   Occupational History  . Not on file.   Social History Main Topics  . Smoking status: Never Smoker   . Smokeless tobacco: Never Used  . Alcohol Use: No  . Drug Use: No  . Sexual Activity: Not on file   Other Topics Concern  . Not on file   Social History Narrative   Lives with spouse in [place].    Review of systems: The patient specifically denies any chest pain at rest or with exertion, dyspnea at rest or with exertion, orthopnea, paroxysmal nocturnal dyspnea, syncope, palpitations, focal neurological deficits, intermittent claudication, lower extremity edema, unexplained weight gain, cough, hemoptysis or wheezing.  The patient also denies abdominal pain, nausea, vomiting, dysphagia, diarrhea, constipation, polyuria, polydipsia, dysuria, hematuria, frequency, urgency, abnormal bleeding or bruising, fever, chills, unexpected weight changes, mood swings, change in skin or hair texture, change in voice quality, auditory or visual problems, allergic reactions or rashes, new musculoskeletal complaints other than usual "aches and pains".   PHYSICAL EXAM BP 130/80  Pulse 80  Resp 16  Ht 5' (1.524 m)  Wt 138 lb 11.2 oz (62.914 kg)  BMI 27.09 kg/m2  General: Alert, oriented x3, no distress Head:  no evidence of trauma, PERRL, EOMI, no exophtalmos or lid lag, no myxedema, no xanthelasma; normal ears, nose and oropharynx Neck: normal jugular venous pulsations and no hepatojugular reflux; brisk carotid pulses without delay and no carotid bruits Chest: clear to auscultation, no signs of consolidation by percussion or palpation, normal fremitus, symmetrical and full respiratory excursions; healthy left subclavian pacemaker site Cardiovascular: normal position and quality of the apical impulse, regular rhythm, normal first and second heart sounds, no murmurs, rubs or gallops Abdomen: no tenderness or distention, no masses by palpation,  no abnormal pulsatility or arterial bruits, normal bowel sounds, no hepatosplenomegaly Extremities: no clubbing, cyanosis or edema; 2+ radial, ulnar and brachial pulses bilaterally; 2+ right femoral, posterior tibial and dorsalis pedis pulses; 2+ left femoral, posterior tibial and dorsalis pedis pulses; no subclavian or femoral bruits Neurological: grossly nonfocal   BMET    Component Value Date/Time   NA 141 09/06/2012 0801   K 3.8 09/06/2012 0801   CL 106 09/06/2012 0801   CO2 25 09/06/2012 0801   GLUCOSE 86 09/06/2012 0801   BUN 16 09/06/2012 0801   CREATININE 0.93 09/06/2012 0801   CALCIUM 9.3 09/06/2012 0801   GFRNONAA 54* 09/06/2012 0801   GFRAA 62* 09/06/2012 0801     ASSESSMENT AND PLAN 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 Hopefully the current device will give her a much longer service. Note that she is pacemaker dependent secondary to complete heart block and also has severe sinus node dysfunction with intermittent sinus arrest and greater than 80% atrial pacing. Enrolled in remote monitoring via the care link system.  Atrial flutter Diagnosed in 2010 (unfortunately cannot locate documentation of the arrhythmia), this arrhythmia has not been detected since we have been following her device starting in July of 2012. Therefore not on anticoagulation therapy  Nonsustained ventricular tachycardia Fairly lengthy episodes of nonsustained ventricular tachycardia have been infrequently reported by her pacemaker 2-3 times a year. Normal nuclear stress test August 2012. Normal left ventricle systolic function without significant valvular abnormalities. She is on beta blocker therapy. No changes are recommended  Hypertension Good control  Roni Scow  Thurmon Fair, MD, [facility] [phone] office [phone] pager

## 2012-10-06 NOTE — Assessment & Plan Note (Signed)
Diagnosed in 2010 (unfortunately cannot locate documentation of the arrhythmia), this arrhythmia has not been detected since we have been following her device starting in July of 2012. Therefore not on anticoagulation therapy

## 2012-10-06 NOTE — Assessment & Plan Note (Signed)
Her lead parameters are excellent, her initial pacemaker generator had a couple of electronic failures including an unexplained "reset" and early battery depletion. Hopefully the current device will give her a much longer service. Note that she is pacemaker dependent secondary to complete heart block and also has severe sinus node dysfunction with intermittent sinus arrest and greater than 80% atrial pacing. Enrolled in remote monitoring via the care link system.

## 2012-10-06 NOTE — Assessment & Plan Note (Signed)
Fairly lengthy episodes of nonsustained ventricular tachycardia have been infrequently reported by her pacemaker 2-3 times a year. Normal nuclear stress test August 2012. Normal left ventricle systolic function without significant valvular abnormalities. She is on beta blocker therapy. No changes are recommended

## 2012-11-19 ENCOUNTER — Encounter: Payer: Self-pay | Admitting: Cardiology

## 2012-11-26 ENCOUNTER — Telehealth: Payer: Self-pay | Admitting: *Deleted

## 2012-11-26 NOTE — Telephone Encounter (Signed)
Pt is confused to when she needs to get her pacer checked. She thought it was the 2nd week of October but wrote down the 2nd week of November. She has not done it yet but is worried.

## 2012-11-26 NOTE — Telephone Encounter (Signed)
Clarified date for transmission with patient.

## 2012-11-27 ENCOUNTER — Ambulatory Visit (INDEPENDENT_AMBULATORY_CARE_PROVIDER_SITE_OTHER): Payer: Medicare Other

## 2012-11-27 DIAGNOSIS — I472 Ventricular tachycardia: Secondary | ICD-10-CM

## 2012-11-27 DIAGNOSIS — I442 Atrioventricular block, complete: Secondary | ICD-10-CM

## 2012-11-27 DIAGNOSIS — I4891 Unspecified atrial fibrillation: Secondary | ICD-10-CM

## 2012-11-27 DIAGNOSIS — I4729 Other ventricular tachycardia: Secondary | ICD-10-CM

## 2012-11-27 LAB — PACEMAKER DEVICE OBSERVATION

## 2012-12-05 ENCOUNTER — Encounter: Payer: Self-pay | Admitting: *Deleted

## 2012-12-05 LAB — REMOTE PACEMAKER DEVICE
AL IMPEDENCE PM: 584 Ohm
BAMS-0001: 175 {beats}/min
RV LEAD THRESHOLD: 0.375 V

## 2012-12-09 ENCOUNTER — Telehealth: Payer: Self-pay | Admitting: *Deleted

## 2012-12-09 NOTE — Telephone Encounter (Signed)
Spoke to patient regarding AF seen on [facility]. Patient aware that an appointment needs to be scheduled. Scheduling has been deferred to [facility].

## 2012-12-11 ENCOUNTER — Ambulatory Visit (INDEPENDENT_AMBULATORY_CARE_PROVIDER_SITE_OTHER): Payer: Medicare Other | Admitting: Cardiovascular Disease

## 2012-12-11 ENCOUNTER — Encounter: Payer: Self-pay | Admitting: Cardiovascular Disease

## 2012-12-11 VITALS — BP 150/82 | HR 76 | Resp 16 | Ht 62.0 in | Wt 141.8 lb

## 2012-12-11 DIAGNOSIS — I472 Ventricular tachycardia: Secondary | ICD-10-CM

## 2012-12-11 DIAGNOSIS — Z79899 Other long term (current) drug therapy: Secondary | ICD-10-CM

## 2012-12-11 DIAGNOSIS — I442 Atrioventricular block, complete: Secondary | ICD-10-CM

## 2012-12-11 DIAGNOSIS — I4892 Unspecified atrial flutter: Secondary | ICD-10-CM

## 2012-12-11 MED ORDER — RIVAROXABAN 10 MG PO TABS: 10.0000 mg | ORAL_TABLET | Freq: Every day | ORAL | Status: DC

## 2012-12-11 NOTE — Assessment & Plan Note (Signed)
She was remotely diagnosed with atrial flutter in 2010, but had not been on anticoagulation therapy since the arrhythmia had not recurred during years of monitoring by her pacemaker.  We discussed the increased risk of stroke and other embolic events with atrial fibrillation. We also discussed the bleeding complications that could be associated with stroke preventing medications. We reviewed the pros and cons of classic warfarin therapy versus novel anticoagulants. We decided together that she is best suited for treatment with a novel agent and she'll start Xarelto 10 mg daily. She met with our clinical pharmacist today to discuss issues related to this medication. She is elderly and of relatively small build and I elected to prescribe the 10 mg dose'

## 2012-12-11 NOTE — Patient Instructions (Addendum)
Remote monitoring is used to monitor your pacemaker from home. This monitoring reduces the number of office visits required to check your device to one time per year. It allows [place] to keep an eye on the functioning of your device to ensure it is working properly. You are scheduled for a device check from home on 03-03-2013. You may send your transmission at any time that day. If you have a wireless device, the transmission will be sent automatically. After your physician reviews your transmission, you will receive a postcard with your next transmission date.  Start Xarelto 10mg  one tablet daily.  A prescription has been sent to your pharmacy electronically.  Continue the aspirin at this time.  Have blood work done in 2 weeks at [facility] or Dr. Patty Sermons' office.  Your physician recommends that you schedule a follow-up appointment in:  6 months.

## 2012-12-11 NOTE — Progress Notes (Signed)
Patient ID: Toni Sanders, female   DOB: 08/19/1922, 79 y.o.   MRN: [id number]      Reason for office visit Newly diagnosed atrial fibrillation  I asked Toni Sanders to come in today to discuss initiation of anticoagulant therapy for newly diagnosed atrial fibrillation. This was incidentally found during a routine remote pacemaker check performed last week. The longest episode of atrial fibrillation actually occurred in mid August and lasted for about a total of 12 hours. The ventricular rate was well controlled and the patient was completely unaware of the arrhythmia. She does not have a history of strokes, TIA or other embolic events. She does have hypertension and is an octogenarian. She does not have a history of any bleeding problems other than easy bruising of the forearms and occasional epistaxis in the wintertime. In the remote past she took warfarin for DVT in the postpartum period. She recalls the inconvenience and tribulations of prothrombin time monitoring for warfarin therapy. She remains very physically active, takes care of all of her housework and even climbs stepladders to wash her windows. She does not have balance problems and has not fallen.   Allergies  Allergen Reactions  . Anesthetics, Amide     "hard to come out of"  . Anesthetics, Ester   . Anesthetics, Halogenated   . Morphine And Related Nausea And Vomiting    Current Outpatient Prescriptions  Medication Sig Dispense Refill  . aspirin 81 MG tablet 1 tab when pt remebers       . fish oil-omega-3 fatty acids 1000 MG capsule Take 1 g by mouth daily. 1 tab po qd      . metoprolol (TOPROL-XL) 50 MG 24 hr tablet Take 50 mg by mouth daily.       . rivaroxaban (XARELTO) 10 MG TABS tablet Take 1 tablet (10 mg total) by mouth daily.  30 tablet  5   No current facility-administered medications for this visit.    Past Medical History  Diagnosis Date  . Atrial flutter 2010  . Bradycardia 2010  . Complete heart block  2010    s/p PPM by Dr Deborah Chalk (MDT) 06/16/08  . Osteoporosis   . LVH (left ventricular hypertrophy) 09/22/10    Echo >55% mild LVH   . H/O cardiovascular stress test 09/22/10    no ischemia, EF >60%    Past Surgical History  Procedure Laterality Date  . Pacemaker placement  06/16/08    Implantation of dual-chamber Medtronic PPMY by Dr Deborah Chalk  . Cholecystectomy  1994  . Pacemaker generator change  09/06/12    new medtronic generator Adapta L secondary to early battery depletion    Family History  Problem Relation Age of Onset  . Hypertension    . Cancer - Prostate Father   . Cancer - Ovarian Maternal Grandmother   . Pneumonia Paternal Grandmother     History   Social History  . Marital Status: Married    Spouse Name: N/A    Number of Children: N/A  . Years of Education: N/A   Occupational History  . Not on file.   Social History Main Topics  . Smoking status: Never Smoker   . Smokeless tobacco: Never Used  . Alcohol Use: No  . Drug Use: No  . Sexual Activity: Not on file   Other Topics Concern  . Not on file   Social History Narrative   Lives with spouse in [place].    Review of systems: The patient specifically  denies any chest pain at rest or with exertion, dyspnea at rest or with exertion, orthopnea, paroxysmal nocturnal dyspnea, syncope, palpitations, focal neurological deficits, intermittent claudication, lower extremity edema, unexplained weight gain, cough, hemoptysis or wheezing.  The patient also denies abdominal pain, nausea, vomiting, dysphagia, diarrhea, constipation, polyuria, polydipsia, dysuria, hematuria, frequency, urgency, abnormal bleeding or bruising, fever, chills, unexpected weight changes, mood swings, change in skin or hair texture, change in voice quality, auditory or visual problems, allergic reactions or rashes, new musculoskeletal complaints other than usual "aches and pains".   PHYSICAL EXAM BP 150/82  Pulse 76  Resp 16  Ht 5\' 2"   (1.575 m)  Wt 141 lb 12.8 oz (64.32 kg)  BMI 25.93 kg/m2  General: Alert, oriented x3, no distress Head: no evidence of trauma, PERRL, EOMI, no exophtalmos or lid lag, no myxedema, no xanthelasma; normal ears, nose and oropharynx Neck: normal jugular venous pulsations and no hepatojugular reflux; brisk carotid pulses without delay and no carotid bruits Chest: clear to auscultation, no signs of consolidation by percussion or palpation, normal fremitus, symmetrical and full respiratory excursions; gradient pacemaker site appears healthy Cardiovascular: normal position and quality of the apical impulse, regular rhythm, normal first and second heart sounds, no murmurs, rubs or gallops Abdomen: no tenderness or distention, no masses by palpation, no abnormal pulsatility or arterial bruits, normal bowel sounds, no hepatosplenomegaly Extremities: no clubbing, cyanosis or edema; 2+ radial, ulnar and brachial pulses bilaterally; 2+ right femoral, posterior tibial and dorsalis pedis pulses; 2+ left femoral, posterior tibial and dorsalis pedis pulses; no subclavian or femoral bruits Neurological: grossly nonfocal   EKG: Atrioventricular sequential pacing  BMET    Component Value Date/Time   NA 141 09/06/2012 0801   K 3.8 09/06/2012 0801   CL 106 09/06/2012 0801   CO2 25 09/06/2012 0801   GLUCOSE 86 09/06/2012 0801   BUN 16 09/06/2012 0801   CREATININE 0.93 09/06/2012 0801   CALCIUM 9.3 09/06/2012 0801   GFRNONAA 54* 09/06/2012 0801   GFRAA 62* 09/06/2012 0801     ASSESSMENT AND PLAN Atrial fibrillation She was remotely diagnosed with atrial flutter in 2010, but had not been on anticoagulation therapy since the arrhythmia had not recurred during years of monitoring by her pacemaker.  We discussed the increased risk of stroke and other embolic events with atrial fibrillation. We also discussed the bleeding complications that could be associated with stroke preventing medications. We reviewed the pros and cons of  classic warfarin therapy versus novel anticoagulants. We decided together that she is best suited for treatment with a novel agent and she'll start Xarelto 10 mg daily. She met with our clinical pharmacist today to discuss issues related to this medication. She is elderly and of relatively small build and I elected to prescribe the 10 mg dose'   Orders Placed This Encounter  Procedures  . Basic Metabolic Panel (BMET)  . EKG 12-Lead   Meds ordered this encounter  Medications  . rivaroxaban (XARELTO) 10 MG TABS tablet    Sig: Take 1 tablet (10 mg total) by mouth daily.    Dispense:  30 tablet    Refill:  5    Toni Sanders  Thurmon Fair, MD, [facility] [phone] office [phone] pager

## 2013-01-13 ENCOUNTER — Encounter: Payer: Self-pay | Admitting: Cardiovascular Disease

## 2013-02-20 ENCOUNTER — Telehealth: Payer: Self-pay | Admitting: Pharmacist Clinician (PhC)/ Clinical Pharmacy Specialist

## 2013-02-20 NOTE — Telephone Encounter (Signed)
Pt LMOM stating BC/BS is limiting her to 35 tabs of Xarelto every 90 days.  Wanted to know if there was an available alternative.

## 2013-02-20 NOTE — Telephone Encounter (Signed)
Spoke with patient.  I believe the reasoning on quantity is due to being the 10mg  dose.  BC/BS is not covering Eliquis in 2015, pt does not want warfarin.  Advised her that we could increase to 15mg  dose and see if that would be covered, but pt hesitant to go above 10mg .  She stated she will work with pharmacy and if becomes cost prohibitive she will call back

## 2013-02-25 ENCOUNTER — Telehealth: Payer: Self-pay | Admitting: *Deleted

## 2013-02-25 MED ORDER — RIVAROXABAN 15 MG PO TABS: 15.0000 mg | ORAL_TABLET | Freq: Every day | ORAL | Status: DC

## 2013-02-25 NOTE — Telephone Encounter (Signed)
Insurance will only cover #35 Xarelto 10mg  because this dose is indicated for DVT.  She has agreed to start 15mg  QD so she does not have to pay $300+ a month, but she is concerned she may have some bleeding.  Instructed to call if she has any problems.  New Rx sent to pharmacy.

## 2013-02-28 ENCOUNTER — Encounter: Payer: Medicare Other | Admitting: *Deleted

## 2013-03-03 ENCOUNTER — Ambulatory Visit (INDEPENDENT_AMBULATORY_CARE_PROVIDER_SITE_OTHER): Payer: Medicare Other | Admitting: *Deleted

## 2013-03-03 DIAGNOSIS — I442 Atrioventricular block, complete: Secondary | ICD-10-CM

## 2013-03-03 DIAGNOSIS — I4729 Other ventricular tachycardia: Secondary | ICD-10-CM

## 2013-03-03 DIAGNOSIS — I4891 Unspecified atrial fibrillation: Secondary | ICD-10-CM

## 2013-03-03 DIAGNOSIS — I472 Ventricular tachycardia: Secondary | ICD-10-CM

## 2013-03-03 LAB — PACEMAKER DEVICE OBSERVATION

## 2013-03-04 ENCOUNTER — Encounter: Payer: Self-pay | Admitting: *Deleted

## 2013-03-05 LAB — MDC_IDC_ENUM_SESS_TYPE_REMOTE
Battery Remaining Longevity: 149 mo
Battery Voltage: 2.78 V
Lead Channel Impedance Value: 592 Ohm
Lead Channel Pacing Threshold Amplitude: 0.625 V
Lead Channel Pacing Threshold Pulse Width: 0.4 ms
Lead Channel Setting Pacing Amplitude: 1.5 V
Lead Channel Setting Sensing Sensitivity: 2.8 mV
MDC IDC MSMT BATTERY IMPEDANCE: 100 Ohm
MDC IDC MSMT LEADCHNL RA PACING THRESHOLD AMPLITUDE: 0.375 V
MDC IDC MSMT LEADCHNL RA PACING THRESHOLD PULSEWIDTH: 0.4 ms
MDC IDC MSMT LEADCHNL RV IMPEDANCE VALUE: 600 Ohm
MDC IDC SESS DTM: 20150126224035
MDC IDC SET LEADCHNL RV PACING AMPLITUDE: 2 V
MDC IDC SET LEADCHNL RV PACING PULSEWIDTH: 0.4 ms
MDC IDC STAT BRADY AP VP PERCENT: 80 %
MDC IDC STAT BRADY AP VS PERCENT: 0 %
MDC IDC STAT BRADY AS VP PERCENT: 20 %
MDC IDC STAT BRADY AS VS PERCENT: 0 %

## 2013-03-12 ENCOUNTER — Encounter: Payer: Self-pay | Admitting: *Deleted

## 2013-06-20 ENCOUNTER — Encounter: Payer: Self-pay | Admitting: Cardiovascular Disease

## 2013-06-20 ENCOUNTER — Ambulatory Visit (INDEPENDENT_AMBULATORY_CARE_PROVIDER_SITE_OTHER): Payer: Medicare Other | Admitting: Cardiovascular Disease

## 2013-06-20 VITALS — BP 148/72 | HR 78 | Resp 16 | Ht 61.0 in | Wt 140.3 lb

## 2013-06-20 DIAGNOSIS — Z950 Presence of cardiac pacemaker: Secondary | ICD-10-CM

## 2013-06-20 DIAGNOSIS — I4729 Other ventricular tachycardia: Secondary | ICD-10-CM

## 2013-06-20 DIAGNOSIS — I472 Ventricular tachycardia: Secondary | ICD-10-CM

## 2013-06-20 DIAGNOSIS — I4891 Unspecified atrial fibrillation: Secondary | ICD-10-CM

## 2013-06-20 DIAGNOSIS — I442 Atrioventricular block, complete: Secondary | ICD-10-CM

## 2013-06-20 LAB — PACEMAKER DEVICE OBSERVATION

## 2013-06-20 NOTE — Patient Instructions (Signed)
Remote monitoring is used to monitor your Pacemaker of ICD from home. This monitoring reduces the number of office visits required to check your device to one time per year. It allows [place] to keep an eye on the functioning of your device to ensure it is working properly. You are scheduled for a device check from home on August 20th, 2015. You may send your transmission at any time that day. If you have a wireless device, the transmission will be sent automatically. After your physician reviews your transmission, you will receive a postcard with your next transmission date.  Dr Sallyanne Kuster wants you to follow-up in 12 months. You will receive a reminder letter in the mail one months in advance. If you don't receive a letter, please call our office to schedule the follow-up appointment.

## 2013-06-21 LAB — MDC_IDC_ENUM_SESS_TYPE_INCLINIC
Brady Statistic AP VP Percent: 81.1 %
Brady Statistic AS VP Percent: 18.9 %
Brady Statistic AS VS Percent: 0.1 % — CL
Lead Channel Impedance Value: 582 Ohm
Lead Channel Impedance Value: 586 Ohm
Lead Channel Pacing Threshold Pulse Width: 0.4 ms
Lead Channel Sensing Intrinsic Amplitude: 2.8 mV
MDC IDC MSMT BATTERY IMPEDANCE: 100 Ohm
MDC IDC MSMT BATTERY VOLTAGE: 2.78 V
MDC IDC MSMT LEADCHNL RA PACING THRESHOLD AMPLITUDE: 0.5 V
MDC IDC MSMT LEADCHNL RV PACING THRESHOLD AMPLITUDE: 0.75 V
MDC IDC MSMT LEADCHNL RV PACING THRESHOLD PULSEWIDTH: 0.4 ms
MDC IDC MSMT LEADCHNL RV SENSING INTR AMPL: 11.2 mV
MDC IDC SET LEADCHNL RA PACING AMPLITUDE: 1.5 V
MDC IDC SET LEADCHNL RV PACING AMPLITUDE: 2 V
MDC IDC SET LEADCHNL RV PACING PULSEWIDTH: 0.4 ms
MDC IDC SET LEADCHNL RV SENSING SENSITIVITY: 2.8 mV
MDC IDC STAT BRADY AP VS PERCENT: 0.1 % — AB

## 2013-06-22 ENCOUNTER — Encounter: Payer: Self-pay | Admitting: Cardiovascular Disease

## 2013-06-22 NOTE — Assessment & Plan Note (Signed)
Infrequent and asymptomatic. On Xarelto

## 2013-06-22 NOTE — Progress Notes (Signed)
Patient ID: Toni Sanders, female   DOB: 1922-04-16, 80 y.o.   MRN: [id number]     Reason for office visit Complete heart block, paroxysmal atrial fibrillation, pacemaker  Mrs. Caldeira feels well. She is unaware of any palpitations or dyspnea. She has not had CVA/TIA symptoms and dnies any bleeding beyond an occasional streak of blood when she blows her nose. She continues to live independently and has not had falls.  Her pacemaker recorded over 120 episodes of mode switch, mostly very brief. One episode of PAF lasted almost 6 hors. Occasional episodes of NSVT continue (usually 2-3 every device check). The longest NSVT episode on the current check was 14 beats long. Never symptomatic. There is 81% A pacing and 100% V pacing. She does have an idioventricular escape rhythm at 40 bpm. Leads (implanted 2010) have normal parameters. Generator (2014) expected longevity >12 years.   Allergies  Allergen Reactions  . Anesthetics, Amide     "hard to come out of"  . Anesthetics, Ester   . Anesthetics, Halogenated   . Morphine And Related Nausea And Vomiting    Current Outpatient Prescriptions  Medication Sig Dispense Refill  . aspirin 81 MG tablet 1 tab when pt remebers       . fish oil-omega-3 fatty acids 1000 MG capsule Take 1 g by mouth daily. 1 tab po qd      . metoprolol (TOPROL-XL) 50 MG 24 hr tablet Take 50 mg by mouth daily.       . Rivaroxaban (XARELTO) 15 MG TABS tablet Take 1 tablet (15 mg total) by mouth daily with supper.  30 tablet  6   No current facility-administered medications for this visit.    Past Medical History  Diagnosis Date  . Atrial flutter 2010  . Bradycardia 2010  . Complete heart block 2010    s/p PPM by Dr Doreatha Lew (MDT) 06/16/08  . Osteoporosis   . LVH (left ventricular hypertrophy) 09/22/10    Echo >55% mild LVH   . H/O cardiovascular stress test 09/22/10    no ischemia, EF >60%    Past Surgical History  Procedure Laterality Date  . Pacemaker placement   06/16/08    Implantation of dual-chamber Medtronic PPMY by Dr Doreatha Lew  . Cholecystectomy  1994  . Pacemaker generator change  09/06/12    new medtronic generator Adapta L secondary to early battery depletion    Family History  Problem Relation Age of Onset  . Hypertension    . Cancer - Prostate Father   . Cancer - Ovarian Maternal Grandmother   . Pneumonia Paternal Grandmother     History   Social History  . Marital Status: Married    Spouse Name: N/A    Number of Children: N/A  . Years of Education: N/A   Occupational History  . Not on file.   Social History Main Topics  . Smoking status: Never Smoker   . Smokeless tobacco: Never Used  . Alcohol Use: No  . Drug Use: No  . Sexual Activity: Not on file   Other Topics Concern  . Not on file   Social History Narrative   Lives with spouse in [place].    Review of systems: The patient specifically denies any chest pain at rest or with exertion, dyspnea at rest or with exertion, orthopnea, paroxysmal nocturnal dyspnea, syncope, palpitations, focal neurological deficits, intermittent claudication, lower extremity edema, unexplained weight gain, cough, hemoptysis or wheezing.  The patient also denies abdominal pain, nausea,  vomiting, dysphagia, diarrhea, constipation, polyuria, polydipsia, dysuria, hematuria, frequency, urgency, abnormal bleeding or bruising, fever, chills, unexpected weight changes, mood swings, change in skin or hair texture, change in voice quality, auditory or visual problems, allergic reactions or rashes, new musculoskeletal complaints other than usual "aches and pains".   PHYSICAL EXAM BP 148/72  Pulse 78  Resp 16  Ht 5\' 1"  (1.549 m)  Wt 140 lb 4.8 oz (63.64 kg)  BMI 26.52 kg/m2 General: Alert, oriented x3, no distress  Head: no evidence of trauma, PERRL, EOMI, no exophtalmos or lid lag, no myxedema, no xanthelasma; normal ears, nose and oropharynx  Neck: normal jugular venous pulsations and no  hepatojugular reflux; brisk carotid pulses without delay and no carotid bruits  Chest: clear to auscultation, no signs of consolidation by percussion or palpation, normal fremitus, symmetrical and full respiratory excursions; healthy left subclavian pacemaker site  Cardiovascular: normal position and quality of the apical impulse, regular rhythm, normal first and second heart sounds, no murmurs, rubs or gallops  Abdomen: no tenderness or distention, no masses by palpation, no abnormal pulsatility or arterial bruits, normal bowel sounds, no hepatosplenomegaly  Extremities: no clubbing, cyanosis or edema; 2+ radial, ulnar and brachial pulses bilaterally; 2+ right femoral, posterior tibial and dorsalis pedis pulses; 2+ left femoral, posterior tibial and dorsalis pedis pulses; no subclavian or femoral bruits  Neurological: grossly nonfocal   EKG: AV paced  BMET    Component Value Date/Time   NA 141 09/06/2012 0801   K 3.8 09/06/2012 0801   CL 106 09/06/2012 0801   CO2 25 09/06/2012 0801   GLUCOSE 86 09/06/2012 0801   BUN 16 09/06/2012 0801   CREATININE 0.93 09/06/2012 0801   CALCIUM 9.3 09/06/2012 0801   GFRNONAA 54* 09/06/2012 0801   GFRAA 62* 09/06/2012 0801     ASSESSMENT AND PLAN  Atrial fibrillation Infrequent and asymptomatic. On Xarelto  Nonsustained ventricular tachycardia No change in pattern. She takes a beta blocker. Normal LVEF and normal nuclear perfusion study.  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 No reprogramming was needed. She has had some unusual blinking on her transmitter at home, but we have been receiving her downloads without a problem.   Patient Instructions  Remote monitoring is used to monitor your Pacemaker of ICD from home. This monitoring reduces the number of office visits required to check your device to one time per year. It allows [place] to keep an eye on the  functioning of your device to ensure it is working properly. You are scheduled for a device check from home on August 20th, 2015. You may send your transmission at any time that day. If you have a wireless device, the transmission will be sent automatically. After your physician reviews your transmission, you will receive a postcard with your next transmission date.  Dr Sallyanne Kuster wants you to follow-up in 12 months. You will receive a reminder letter in the mail one months in advance. If you don't receive a letter, please call our office to schedule the follow-up appointment.   Orders Placed This Encounter  Procedures  . Implantable device check  . EKG 12-Lead   No orders of the defined types were placed in this encounter.    Karter Hellmer  Sanda Klein, MD, [facility] [phone] office [phone] pager

## 2013-06-22 NOTE — Assessment & Plan Note (Signed)
No change in pattern. She takes a beta blocker. Normal LVEF and normal nuclear perfusion study.

## 2013-06-22 NOTE — Assessment & Plan Note (Signed)
Should be considered pacemaker dependent, although there is an idioventricular escape. No reprogramming was needed. She has had some unusual blinking on her transmitter at home, but we have been receiving her downloads without a problem.

## 2013-09-25 ENCOUNTER — Encounter: Payer: Self-pay | Admitting: Cardiovascular Disease

## 2013-09-25 ENCOUNTER — Telehealth: Payer: Self-pay | Admitting: Cardiology

## 2013-09-25 ENCOUNTER — Ambulatory Visit (INDEPENDENT_AMBULATORY_CARE_PROVIDER_SITE_OTHER): Payer: Medicare Other | Admitting: *Deleted

## 2013-09-25 DIAGNOSIS — I442 Atrioventricular block, complete: Secondary | ICD-10-CM

## 2013-09-25 LAB — MDC_IDC_ENUM_SESS_TYPE_REMOTE
Battery Impedance: 100 Ohm
Brady Statistic AS VS Percent: 0 %
Lead Channel Pacing Threshold Amplitude: 0.625 V
Lead Channel Pacing Threshold Pulse Width: 0.4 ms
Lead Channel Pacing Threshold Pulse Width: 0.4 ms
Lead Channel Setting Pacing Amplitude: 1.5 V
Lead Channel Setting Pacing Pulse Width: 0.4 ms
MDC IDC MSMT BATTERY REMAINING LONGEVITY: 150 mo
MDC IDC MSMT BATTERY VOLTAGE: 2.78 V
MDC IDC MSMT LEADCHNL RA IMPEDANCE VALUE: 592 Ohm
MDC IDC MSMT LEADCHNL RA PACING THRESHOLD AMPLITUDE: 0.375 V
MDC IDC MSMT LEADCHNL RV IMPEDANCE VALUE: 646 Ohm
MDC IDC SESS DTM: 20150820181417
MDC IDC SET LEADCHNL RV PACING AMPLITUDE: 2 V
MDC IDC SET LEADCHNL RV SENSING SENSITIVITY: 2.8 mV
MDC IDC STAT BRADY AP VP PERCENT: 85 %
MDC IDC STAT BRADY AP VS PERCENT: 0 %
MDC IDC STAT BRADY AS VP PERCENT: 15 %

## 2013-09-25 NOTE — Telephone Encounter (Signed)
Spoke with pt and reminded pt of remote transmission that is due today. Pt verbalized understanding.   

## 2013-09-26 NOTE — Progress Notes (Signed)
Remote pacemaker transmission.   

## 2013-10-08 ENCOUNTER — Telehealth: Payer: Self-pay | Admitting: Cardiovascular Disease

## 2013-10-08 NOTE — Telephone Encounter (Signed)
Patient informed that remote was received. 

## 2013-10-08 NOTE — Telephone Encounter (Signed)
Please let her know if you received a transmital from 09-25-13.

## 2013-10-20 ENCOUNTER — Other Ambulatory Visit: Payer: Self-pay | Admitting: Cardiovascular Disease

## 2013-10-28 ENCOUNTER — Encounter: Payer: Self-pay | Admitting: Cardiology

## 2014-01-05 ENCOUNTER — Encounter: Payer: Self-pay | Admitting: Cardiovascular Disease

## 2014-01-05 ENCOUNTER — Telehealth: Payer: Self-pay | Admitting: Cardiology

## 2014-01-05 ENCOUNTER — Ambulatory Visit (INDEPENDENT_AMBULATORY_CARE_PROVIDER_SITE_OTHER): Payer: Medicare Other | Admitting: *Deleted

## 2014-01-05 DIAGNOSIS — I442 Atrioventricular block, complete: Secondary | ICD-10-CM

## 2014-01-05 NOTE — Telephone Encounter (Signed)
Attempted to call pt and confirm remote transmission for today. No answer and unable to leave a message.

## 2014-01-06 NOTE — Progress Notes (Signed)
Remote pacemaker transmission.   

## 2014-01-08 LAB — MDC_IDC_ENUM_SESS_TYPE_REMOTE
Brady Statistic AP VP Percent: 86 %
Brady Statistic AP VS Percent: 0 %
Brady Statistic AS VP Percent: 14 %
Brady Statistic AS VS Percent: 0 %
Date Time Interrogation Session: 20151130223503
Lead Channel Impedance Value: 574 Ohm
Lead Channel Impedance Value: 600 Ohm
Lead Channel Pacing Threshold Amplitude: 0.25 V
Lead Channel Pacing Threshold Amplitude: 0.5 V
Lead Channel Pacing Threshold Pulse Width: 0.4 ms
Lead Channel Setting Pacing Amplitude: 2 V
Lead Channel Setting Sensing Sensitivity: 2.8 mV
MDC IDC MSMT BATTERY IMPEDANCE: 110 Ohm
MDC IDC MSMT BATTERY REMAINING LONGEVITY: 145 mo
MDC IDC MSMT BATTERY VOLTAGE: 2.78 V
MDC IDC MSMT LEADCHNL RV PACING THRESHOLD PULSEWIDTH: 0.4 ms
MDC IDC SET LEADCHNL RA PACING AMPLITUDE: 1.5 V
MDC IDC SET LEADCHNL RV PACING PULSEWIDTH: 0.4 ms

## 2014-01-12 ENCOUNTER — Encounter: Payer: Self-pay | Admitting: Cardiology

## 2014-01-15 ENCOUNTER — Encounter (HOSPITAL_COMMUNITY): Payer: Self-pay | Admitting: Cardiovascular Disease

## 2014-04-07 ENCOUNTER — Ambulatory Visit (INDEPENDENT_AMBULATORY_CARE_PROVIDER_SITE_OTHER): Payer: Medicare Other | Admitting: *Deleted

## 2014-04-07 ENCOUNTER — Telehealth: Payer: Self-pay | Admitting: Cardiology

## 2014-04-07 DIAGNOSIS — I442 Atrioventricular block, complete: Secondary | ICD-10-CM

## 2014-04-07 NOTE — Telephone Encounter (Signed)
Confirmed remote transmission w/ pt daughter.   

## 2014-04-08 NOTE — Progress Notes (Signed)
Remote pacemaker transmission.   

## 2014-04-12 LAB — MDC_IDC_ENUM_SESS_TYPE_REMOTE
Battery Remaining Longevity: 144 mo
Battery Voltage: 2.78 V
Date Time Interrogation Session: 20160301211556
Lead Channel Pacing Threshold Amplitude: 0.375 V
Lead Channel Pacing Threshold Pulse Width: 0.4 ms
Lead Channel Setting Pacing Pulse Width: 0.4 ms
Lead Channel Setting Sensing Sensitivity: 2.8 mV
MDC IDC MSMT BATTERY IMPEDANCE: 110 Ohm
MDC IDC MSMT LEADCHNL RA IMPEDANCE VALUE: 582 Ohm
MDC IDC MSMT LEADCHNL RV IMPEDANCE VALUE: 558 Ohm
MDC IDC MSMT LEADCHNL RV PACING THRESHOLD AMPLITUDE: 0.5 V
MDC IDC MSMT LEADCHNL RV PACING THRESHOLD PULSEWIDTH: 0.4 ms
MDC IDC SET LEADCHNL RA PACING AMPLITUDE: 1.5 V
MDC IDC SET LEADCHNL RV PACING AMPLITUDE: 2 V
MDC IDC STAT BRADY AP VP PERCENT: 85 %
MDC IDC STAT BRADY AP VS PERCENT: 0 %
MDC IDC STAT BRADY AS VP PERCENT: 15 %
MDC IDC STAT BRADY AS VS PERCENT: 0 %

## 2014-04-16 ENCOUNTER — Encounter: Payer: Self-pay | Admitting: *Deleted

## 2014-04-23 ENCOUNTER — Encounter: Payer: Self-pay | Admitting: Cardiovascular Disease

## 2014-05-25 ENCOUNTER — Other Ambulatory Visit: Payer: Self-pay | Admitting: Cardiovascular Disease

## 2014-07-07 ENCOUNTER — Encounter: Payer: Self-pay | Admitting: Cardiovascular Disease

## 2014-07-07 ENCOUNTER — Ambulatory Visit (INDEPENDENT_AMBULATORY_CARE_PROVIDER_SITE_OTHER): Payer: Medicare Other | Admitting: Cardiovascular Disease

## 2014-07-07 VITALS — BP 144/78 | HR 72 | Ht 60.0 in | Wt 139.4 lb

## 2014-07-07 DIAGNOSIS — I480 Paroxysmal atrial fibrillation: Secondary | ICD-10-CM | POA: Diagnosis not present

## 2014-07-07 DIAGNOSIS — I4729 Other ventricular tachycardia: Secondary | ICD-10-CM

## 2014-07-07 DIAGNOSIS — I442 Atrioventricular block, complete: Secondary | ICD-10-CM

## 2014-07-07 DIAGNOSIS — I10 Essential (primary) hypertension: Secondary | ICD-10-CM

## 2014-07-07 DIAGNOSIS — I472 Ventricular tachycardia: Secondary | ICD-10-CM | POA: Diagnosis not present

## 2014-07-07 LAB — CUP PACEART INCLINIC DEVICE CHECK
Brady Statistic AP VP Percent: 85.2 %
Brady Statistic AP VS Percent: 0.1 % — CL
Brady Statistic AS VS Percent: 0.1 % — CL
Lead Channel Impedance Value: 584 Ohm
Lead Channel Impedance Value: 598 Ohm
Lead Channel Pacing Threshold Pulse Width: 0.4 ms
Lead Channel Setting Pacing Amplitude: 2 V
MDC IDC MSMT LEADCHNL RA PACING THRESHOLD AMPLITUDE: 0.25 V
MDC IDC MSMT LEADCHNL RV PACING THRESHOLD AMPLITUDE: 0.5 V
MDC IDC MSMT LEADCHNL RV PACING THRESHOLD PULSEWIDTH: 0.4 ms
MDC IDC SESS DTM: 20160531132615
MDC IDC SET LEADCHNL RA PACING AMPLITUDE: 1.5 V
MDC IDC SET LEADCHNL RV PACING PULSEWIDTH: 0.4 ms
MDC IDC SET LEADCHNL RV SENSING SENSITIVITY: 2.8 mV
MDC IDC STAT BRADY AS VP PERCENT: 14.8 %

## 2014-07-07 MED ORDER — METOPROLOL SUCCINATE ER 50 MG PO TB24: ORAL_TABLET | ORAL | Status: DC

## 2014-07-07 NOTE — Patient Instructions (Addendum)
Dr Sallyanne Kuster has recommended making the following medication changes: INCREASE Metoprolol Succ (ToprolXL) to 75 mg - take 1.5 tablets by mouth daily. A new prescription has been sent to your pharmacy electronically. Remote monitoring is used to monitor your pacemaker from home. This monitoring reduces the number of office visits required to check your device to one time per year. It allows [place] to keep an eye on the functioning of your device to ensure it is working properly. You are scheduled for a device check from home on 10/06/2014. You may send your transmission at any time that day. If you have a wireless device, the transmission will be sent automatically. After your physician reviews your transmission, you will receive a postcard with your next transmission date.  Dr Sallyanne Kuster recommends that you schedule a follow-up appointment in 12 months with pacer check. You will receive a reminder letter in the mail two months in advance. If you don't receive a letter, please call our office to schedule the follow-up appointment.

## 2014-07-07 NOTE — Progress Notes (Signed)
Patient ID: [name], female   DOB: 03/25/1922, 81 y.o.   MRN: [id number]      Cardiology Office Note   Date:  07/07/2014   ID:  [name], DOB 1923/01/08, MRN [id number]  PCP:  Geoffery Lyons, MD  Cardiologist:   Sanda Klein, MD   Chief Complaint  Patient presents with  . Annual Exam    no concerns      History of Present Illness: BRENLYNN FAKE is a 81 y.o. female who presents for complete heart block, pacemaker follow-up, systemic hypertension, history of atrial flutter and history of nonsustained ventricular tachycardia.  Mrs. Leeman continues to live independently and feels well. She has normal left ventricular systolic function and no history of coronary artery disease or serious valvular abnormalities.  Interrogation of her dual-chamber Medtronic Adapta pacemaker implanted in 2010 (generator change out 2014) shows normal function. Anticipated generator longevity another 12 years. There is 85% atrial pacing and 100% ventricular pacing. There've been 49 episodes of atrial arrhythmia with an overall burden of atrial fibrillation of 0.1%. Most of the episodes last for less than 2 hours. No atrial fibrillation has been detected since March 25. 2 episodes of nonsustained ventricular tachycardia have been recorded in the last couple of months the longest consisted of 35 beats and lasted for 12 seconds. This happened on May 26. She remembers sitting down at the lunch table and feeling briefly unwell. He did not lose consciousness. Ventricular rate was 226 bpm.  She has not had any bleeding problems on chronic anticoagulation with Xarelto. Her blood pressure is borderline high.    Past Medical History  Diagnosis Date  . Atrial flutter 2010  . Bradycardia 2010  . Complete heart block 2010    s/p PPM by Dr Doreatha Lew (MDT) 06/16/08  . Osteoporosis   . LVH (left ventricular hypertrophy) 09/22/10    Echo >55% mild LVH   . H/O cardiovascular stress test 09/22/10    no  ischemia, EF >60%    Past Surgical History  Procedure Laterality Date  . Pacemaker placement  06/16/08    Implantation of dual-chamber Medtronic PPMY by Dr Doreatha Lew  . Cholecystectomy  1994  . Pacemaker generator change  09/06/12    new medtronic generator Adapta L secondary to early battery depletion  . Pacemaker generator change N/A 09/06/2012    Procedure: PACEMAKER GENERATOR CHANGE;  Surgeon: Sanda Klein, MD;  Location: [place] [facility] CATH [facility];  Service: Cardiovascular;  Laterality: N/A;     Current Outpatient Prescriptions  Medication Sig Dispense Refill  . aspirin 81 MG tablet 1 tab when pt remebers     . fish oil-omega-3 fatty acids 1000 MG capsule Take 1 g by mouth daily. 1 tab po qd    . metoprolol succinate (TOPROL-XL) 50 MG 24 hr tablet Take 1.5 tablets (75 mg total) by mouth daily. 45 tablet 11  . XARELTO 15 MG TABS tablet TAKE ONE TABLET EACH DAY WITH SUPPER 30 tablet 5  . latanoprost (XALATAN) 0.005 % ophthalmic solution Place 1 drop into both eyes daily. At bedtime  1   No current facility-administered medications for this visit.    Allergies:   Anesthetics, amide; Anesthetics, ester; Anesthetics, halogenated; and Morphine and related    Social History:  The patient  reports that she has never smoked. She has never used smokeless tobacco. She reports that she does not drink alcohol or use illicit drugs.   Family History:  The patient's family history includes Cancer - Ovarian  in her maternal grandmother; Cancer - Prostate in her father; Hypertension in an other family member; Pneumonia in her paternal grandmother.    ROS:  Please see the history of present illness.    Otherwise, review of systems positive for none.   All other systems are reviewed and negative.    PHYSICAL EXAM: VS:  BP 144/78 mmHg  Pulse 72  Ht 5' (1.524 m)  Wt 63.231 kg (139 lb 6.4 oz)  BMI 27.22 kg/m2 , BMI Body mass index is 27.22 kg/(m^2).  General: Alert, oriented x3, no distress Head: no evidence  of trauma, PERRL, EOMI, no exophtalmos or lid lag, no myxedema, no xanthelasma; normal ears, nose and oropharynx Neck: normal jugular venous pulsations and no hepatojugular reflux; brisk carotid pulses without delay and no carotid bruits Chest: clear to auscultation, no signs of consolidation by percussion or palpation, normal fremitus, symmetrical and full respiratory excursions, normal left subclavian pacemaker site Cardiovascular: normal position and quality of the apical impulse, regular rhythm, normal first and second heart sounds, no murmurs, rubs or gallops Abdomen: no tenderness or distention, no masses by palpation, no abnormal pulsatility or arterial bruits, normal bowel sounds, no hepatosplenomegaly Extremities: no clubbing, cyanosis or edema; 2+ radial, ulnar and brachial pulses bilaterally; 2+ right femoral, posterior tibial and dorsalis pedis pulses; 2+ left femoral, posterior tibial and dorsalis pedis pulses; no subclavian or femoral bruits Neurological: grossly nonfocal Psych: euthymic mood, full affect   EKG:  EKG is ordered today. The ekg ordered today demonstrates AV sequential pacing   Wt Readings from Last 3 Encounters:  07/07/14 63.231 kg (139 lb 6.4 oz)  06/20/13 63.64 kg (140 lb 4.8 oz)  12/11/12 64.32 kg (141 lb 12.8 oz)    ASSESSMENT AND PLAN:  1. Recurrent nonsustained ventricular tachycardia. This has been repeatedly documented by her pacemaker in the past, but the most recent event was longer than usual and as far as I can tell was the first one that caused any symptoms. Will increase the beta blocker dose. Aggressive investigation and treatment is likely not appropriate in this elderly lady.  2. Essential hypertension - it is likely that her blood pressure will be back in target range on the higher dose of beta blocker  3. Recurrent paroxysmal atrial fibrillation with good ventricular rate control on chronic anticoagulation without side effects.  4. Complete  heart block status post dual-chamber permanent pacemaker/pacemaker dependent   Current medicines are reviewed at length with the patient today.  The patient does not have concerns regarding medicines.  The following changes have been made:  Increased metoprolol succinate to 75 mg daily  Labs/ tests ordered today include:  Orders Placed This Encounter  Procedures  . Implantable device check  . EKG 12-Lead   Patient Instructions  Dr Sallyanne Kuster has recommended making the following medication changes: INCREASE Metoprolol Succ (ToprolXL) to 75 mg - take 1.5 tablets by mouth daily. A new prescription has been sent to your pharmacy electronically. Remote monitoring is used to monitor your pacemaker from home. This monitoring reduces the number of office visits required to check your device to one time per year. It allows [place] to keep an eye on the functioning of your device to ensure it is working properly. You are scheduled for a device check from home on 10/06/2014. You may send your transmission at any time that day. If you have a wireless device, the transmission will be sent automatically. After your physician reviews your transmission, you will receive a  postcard with your next transmission date.  Dr Sallyanne Kuster recommends that you schedule a follow-up appointment in 12 months with pacer check. You will receive a reminder letter in the mail two months in advance. If you don't receive a letter, please call our office to schedule the follow-up appointment.    Mikael Spray, MD  07/07/2014 4:46 PM    Sanda Klein, MD, [facility] [phone] office [phone] pager

## 2014-07-21 ENCOUNTER — Encounter: Payer: Self-pay | Admitting: Cardiovascular Disease

## 2014-10-06 ENCOUNTER — Telehealth: Payer: Self-pay | Admitting: Cardiovascular Disease

## 2014-10-06 ENCOUNTER — Ambulatory Visit (INDEPENDENT_AMBULATORY_CARE_PROVIDER_SITE_OTHER): Payer: Medicare Other | Admitting: *Deleted

## 2014-10-06 ENCOUNTER — Encounter: Payer: Self-pay | Admitting: Cardiovascular Disease

## 2014-10-06 DIAGNOSIS — I442 Atrioventricular block, complete: Secondary | ICD-10-CM | POA: Diagnosis not present

## 2014-10-06 NOTE — Telephone Encounter (Signed)
Pt called in stating that she was suppose to send in a transmission today but she has been having problem getting a good signal. She says that she is about to send one and would like a call if it goes through or not. Please call  Thanks

## 2014-10-06 NOTE — Telephone Encounter (Signed)
Pt stated that she is going to daughter house to send transmission. She doesn't need a call if we receive the transmission.

## 2014-10-06 NOTE — Progress Notes (Signed)
Remote pacemaker transmission.   

## 2014-10-15 LAB — CUP PACEART REMOTE DEVICE CHECK
Battery Impedance: 133 Ohm
Brady Statistic AP VS Percent: 0 %
Brady Statistic AS VP Percent: 13 %
Date Time Interrogation Session: 20160830185713
Lead Channel Pacing Threshold Amplitude: 0.25 V
Lead Channel Pacing Threshold Pulse Width: 0.4 ms
Lead Channel Pacing Threshold Pulse Width: 0.4 ms
Lead Channel Setting Pacing Amplitude: 1.5 V
Lead Channel Setting Pacing Pulse Width: 0.4 ms
Lead Channel Setting Sensing Sensitivity: 2 mV
MDC IDC MSMT BATTERY REMAINING LONGEVITY: 139 mo
MDC IDC MSMT BATTERY VOLTAGE: 2.78 V
MDC IDC MSMT LEADCHNL RA IMPEDANCE VALUE: 574 Ohm
MDC IDC MSMT LEADCHNL RV IMPEDANCE VALUE: 613 Ohm
MDC IDC MSMT LEADCHNL RV PACING THRESHOLD AMPLITUDE: 0.625 V
MDC IDC SET LEADCHNL RV PACING AMPLITUDE: 2 V
MDC IDC STAT BRADY AP VP PERCENT: 87 %
MDC IDC STAT BRADY AS VS PERCENT: 0 %

## 2014-10-23 ENCOUNTER — Encounter: Payer: Self-pay | Admitting: Cardiology

## 2014-11-25 ENCOUNTER — Other Ambulatory Visit: Payer: Self-pay | Admitting: Cardiovascular Disease

## 2015-01-06 ENCOUNTER — Ambulatory Visit (INDEPENDENT_AMBULATORY_CARE_PROVIDER_SITE_OTHER): Payer: Medicare Other | Admitting: *Deleted

## 2015-01-06 DIAGNOSIS — I442 Atrioventricular block, complete: Secondary | ICD-10-CM

## 2015-01-07 NOTE — Progress Notes (Signed)
Remote pacemaker transmission.   

## 2015-01-15 LAB — CUP PACEART REMOTE DEVICE CHECK
Battery Voltage: 2.79 V
Brady Statistic AP VP Percent: 88 %
Brady Statistic AP VS Percent: 0 %
Brady Statistic AS VP Percent: 12 %
Brady Statistic AS VS Percent: 0 %
Implantable Lead Implant Date: 20100511
Implantable Lead Location: 753859
Implantable Lead Location: 753860
Implantable Lead Serial Number: 640302
Lead Channel Impedance Value: 592 Ohm
Lead Channel Impedance Value: 628 Ohm
Lead Channel Pacing Threshold Amplitude: 0.375 V
Lead Channel Pacing Threshold Amplitude: 0.5 V
Lead Channel Pacing Threshold Pulse Width: 0.4 ms
Lead Channel Pacing Threshold Pulse Width: 0.4 ms
Lead Channel Setting Pacing Pulse Width: 0.4 ms
MDC IDC LEAD IMPLANT DT: 20100511
MDC IDC LEAD SERIAL: 525535
MDC IDC MSMT BATTERY IMPEDANCE: 133 Ohm
MDC IDC MSMT BATTERY REMAINING LONGEVITY: 139 mo
MDC IDC SESS DTM: 20161130145147
MDC IDC SET LEADCHNL RA PACING AMPLITUDE: 1.5 V
MDC IDC SET LEADCHNL RV PACING AMPLITUDE: 2 V
MDC IDC SET LEADCHNL RV SENSING SENSITIVITY: 2 mV

## 2015-01-19 ENCOUNTER — Encounter: Payer: Self-pay | Admitting: Cardiology

## 2015-04-07 ENCOUNTER — Telehealth: Payer: Self-pay | Admitting: Cardiology

## 2015-04-07 ENCOUNTER — Ambulatory Visit (INDEPENDENT_AMBULATORY_CARE_PROVIDER_SITE_OTHER): Payer: Medicare Other | Admitting: *Deleted

## 2015-04-07 DIAGNOSIS — I442 Atrioventricular block, complete: Secondary | ICD-10-CM | POA: Diagnosis not present

## 2015-04-07 NOTE — Telephone Encounter (Signed)
Spoke with pt and reminded pt of remote transmission that is due today. Pt verbalized understanding.   

## 2015-04-08 NOTE — Progress Notes (Signed)
Remote pacemaker transmission.   

## 2015-05-12 LAB — CUP PACEART REMOTE DEVICE CHECK
Battery Impedance: 133 Ohm
Battery Remaining Longevity: 140 mo
Brady Statistic AP VS Percent: 0 %
Brady Statistic AS VS Percent: 0 %
Implantable Lead Implant Date: 20100511
Implantable Lead Location: 753860
Implantable Lead Model: 4470
Implantable Lead Serial Number: 525535
Implantable Lead Serial Number: 640302
Lead Channel Impedance Value: 644 Ohm
Lead Channel Pacing Threshold Amplitude: 0.5 V
Lead Channel Pacing Threshold Pulse Width: 0.4 ms
Lead Channel Pacing Threshold Pulse Width: 0.4 ms
Lead Channel Setting Pacing Amplitude: 1.5 V
Lead Channel Setting Pacing Amplitude: 2 V
Lead Channel Setting Sensing Sensitivity: 2 mV
MDC IDC LEAD IMPLANT DT: 20100511
MDC IDC LEAD LOCATION: 753859
MDC IDC MSMT BATTERY VOLTAGE: 2.78 V
MDC IDC MSMT LEADCHNL RA IMPEDANCE VALUE: 601 Ohm
MDC IDC MSMT LEADCHNL RA PACING THRESHOLD AMPLITUDE: 0.25 V
MDC IDC SESS DTM: 20170301182117
MDC IDC SET LEADCHNL RV PACING PULSEWIDTH: 0.4 ms
MDC IDC STAT BRADY AP VP PERCENT: 89 %
MDC IDC STAT BRADY AS VP PERCENT: 11 %

## 2015-05-18 ENCOUNTER — Encounter: Payer: Self-pay | Admitting: Cardiology

## 2015-06-08 DIAGNOSIS — Z85828 Personal history of other malignant neoplasm of skin: Secondary | ICD-10-CM | POA: Diagnosis not present

## 2015-06-08 DIAGNOSIS — L570 Actinic keratosis: Secondary | ICD-10-CM | POA: Diagnosis not present

## 2015-06-08 DIAGNOSIS — L821 Other seborrheic keratosis: Secondary | ICD-10-CM | POA: Diagnosis not present

## 2015-06-09 DIAGNOSIS — M25462 Effusion, left knee: Secondary | ICD-10-CM | POA: Diagnosis not present

## 2015-06-09 DIAGNOSIS — S8992XA Unspecified injury of left lower leg, initial encounter: Secondary | ICD-10-CM | POA: Diagnosis not present

## 2015-06-09 DIAGNOSIS — M25562 Pain in left knee: Secondary | ICD-10-CM | POA: Diagnosis not present

## 2015-06-09 DIAGNOSIS — M7122 Synovial cyst of popliteal space [Baker], left knee: Secondary | ICD-10-CM | POA: Diagnosis not present

## 2015-06-23 ENCOUNTER — Other Ambulatory Visit: Payer: Self-pay | Admitting: Cardiovascular Disease

## 2015-06-23 NOTE — Telephone Encounter (Signed)
Rx request sent to pharmacy.  

## 2015-07-15 DIAGNOSIS — H25812 Combined forms of age-related cataract, left eye: Secondary | ICD-10-CM | POA: Diagnosis not present

## 2015-07-15 DIAGNOSIS — H401132 Primary open-angle glaucoma, bilateral, moderate stage: Secondary | ICD-10-CM | POA: Diagnosis not present

## 2015-07-15 DIAGNOSIS — H04123 Dry eye syndrome of bilateral lacrimal glands: Secondary | ICD-10-CM | POA: Diagnosis not present

## 2015-07-16 ENCOUNTER — Ambulatory Visit (INDEPENDENT_AMBULATORY_CARE_PROVIDER_SITE_OTHER): Payer: Medicare Other | Admitting: Cardiovascular Disease

## 2015-07-16 ENCOUNTER — Encounter: Payer: Self-pay | Admitting: Cardiovascular Disease

## 2015-07-16 VITALS — BP 144/70 | HR 78 | Ht 63.0 in | Wt 135.0 lb

## 2015-07-16 DIAGNOSIS — I480 Paroxysmal atrial fibrillation: Secondary | ICD-10-CM

## 2015-07-16 DIAGNOSIS — I442 Atrioventricular block, complete: Secondary | ICD-10-CM | POA: Diagnosis not present

## 2015-07-16 DIAGNOSIS — I472 Ventricular tachycardia: Secondary | ICD-10-CM | POA: Diagnosis not present

## 2015-07-16 DIAGNOSIS — Z7901 Long term (current) use of anticoagulants: Secondary | ICD-10-CM

## 2015-07-16 DIAGNOSIS — Z950 Presence of cardiac pacemaker: Secondary | ICD-10-CM

## 2015-07-16 DIAGNOSIS — I10 Essential (primary) hypertension: Secondary | ICD-10-CM

## 2015-07-16 DIAGNOSIS — I4729 Other ventricular tachycardia: Secondary | ICD-10-CM

## 2015-07-16 NOTE — Patient Instructions (Signed)
Dr Croitoru recommends that you continue on your current medications as directed. Please refer to the Current Medication list given to you today.  Remote monitoring is used to monitor your Pacemaker of ICD from home. This monitoring reduces the number of office visits required to check your device to one time per year. It allows us to keep an eye on the functioning of your device to ensure it is working properly. You are scheduled for a device check from home on Friday, September 8th, 2017. You may send your transmission at any time that day. If you have a wireless device, the transmission will be sent automatically. After your physician reviews your transmission, you will receive a postcard with your next transmission date.   Dr Croitoru recommends that you schedule a follow-up appointment in 6 months with a pacemaker check. You will receive a reminder letter in the mail two months in advance. If you don't receive a letter, please call our office to schedule the follow-up appointment.  If you need a refill on your cardiac medications before your next appointment, please call your pharmacy. 

## 2015-07-16 NOTE — Progress Notes (Signed)
Cardiology Office Note    Date:  07/16/2015   ID:  Toni Sanders, DOB 11/02/1922, MRN [id number]  PCP:  Geoffery Lyons, MD  Cardiologist:   Sanda Klein, MD   Chief Complaint  Patient presents with  . Annual Exam  . Dizziness    History of Present Illness:  Toni Sanders is a 82 y.o. female with complete heart block, dual-chamber permanent pacemaker, systemic hypertension, history of paroxysmal atrial flutter and atrial fibrillation and history of rare nonsustained VT, returning for follow-up.  She has not had any cardiac problems since her last appointment. She did have a left knee injury and has developed a Baker's cyst. She denies chest pain, syncope, palpitations, shortness of breath at rest or with activity, leg edema, claudication, cough/hemoptysis, focal neurological events or other vascular problems.  Interrogation of her device shows normal function. Her Medtronic Adapta pacemaker was implanted in 2014 and still has roughly 9-12 years of longevity. She has 90% atrial pacing and 100% ventricular pacing. The burden of atrial fibrillation is 0.2%, comparable with historical trends. Many of her recent episodes clustered in late May. They were all asymptomatic. There has been no recent ventricular tachycardia.  She has not had any bleeding problems on chronic anticoagulation with Xarelto.  Past Medical History  Diagnosis Date  . Atrial flutter ([place]) 2010  . Bradycardia 2010  . Complete heart block ([place]) 2010    s/p PPM by Dr Doreatha Lew (MDT) 06/16/08  . Osteoporosis   . LVH (left ventricular hypertrophy) 09/22/10    Echo >55% mild LVH   . H/O cardiovascular stress test 09/22/10    no ischemia, EF >60%    Past Surgical History  Procedure Laterality Date  . Pacemaker placement  06/16/08    Implantation of dual-chamber Medtronic PPMY by Dr Doreatha Lew  . Cholecystectomy  1994  . Pacemaker generator change  09/06/12    new medtronic generator Adapta L secondary to early  battery depletion  . Pacemaker generator change Toni Sanders 09/06/2012    Procedure: PACEMAKER GENERATOR CHANGE;  Surgeon: Sanda Klein, MD;  Location: [facility]ATH [facility];  Service: Cardiovascular;  Laterality: Toni Sanders;    Current Medications: Outpatient Prescriptions Prior to Visit  Medication Sig Dispense Refill  . aspirin 81 MG tablet 1 tab when pt remebers     . fish oil-omega-3 fatty acids 1000 MG capsule Take 1 g by mouth daily. 1 tab po qd    . latanoprost (XALATAN) 0.005 % ophthalmic solution Place 1 drop into both eyes daily. At bedtime  1  . metoprolol succinate (TOPROL-XL) 50 MG 24 hr tablet Take 1.5 tablets (75 mg total) by mouth daily. (Patient taking differently: Take 50 mg by mouth daily. ) 45 tablet 11  . Rivaroxaban (XARELTO) 15 MG TABS tablet Take 1 tablet (15 mg total) by mouth daily with supper. Please schedule appointment for refills. 30 tablet 0   No facility-administered medications prior to visit.     Allergies:   Anesthetics, amide; Anesthetics, ester; Anesthetics, halogenated; and Morphine and related   Social History   Social History  . Marital Status: Married    Spouse Name: Toni Sanders  . Number of Children: Toni Sanders  . Years of Education: Toni Sanders   Social History Main Topics  . Smoking status: Never Smoker   . Smokeless tobacco: Never Used  . Alcohol Use: No  . Drug Use: No  . Sexual Activity: Not Asked   Other Topics Concern  . None   Social History Narrative  Lives with spouse in [place].     Family History:  The patient's family history includes Cancer - Ovarian in her maternal grandmother; Cancer - Prostate in her father; Pneumonia in her paternal grandmother.   ROS:   Please see the history of present illness.    ROS All other systems reviewed and are negative.   PHYSICAL EXAM:   VS:  BP 144/70 mmHg  Pulse 78  Ht 5\' 3"  (1.6 m)  Wt 61.236 kg (135 lb)  BMI 23.92 kg/m2   GEN: Well nourished, well developed, in no acute distress HEENT: normal Neck: no JVD,  carotid bruits, or masses Cardiac: Paradoxically split second heart sound, RRR; no murmurs, rubs, or gallops,no edema , healthy right subclavian pacemaker site Respiratory:  clear to auscultation bilaterally, normal work of breathing GI: soft, nontender, nondistended, + BS MS: no deformity or atrophy Skin: warm and dry, no rash Neuro:  Alert and Oriented x 3, Strength and sensation are intact Psych: euthymic mood, full affect  Wt Readings from Last 3 Encounters:  07/16/15 61.236 kg (135 lb)  07/07/14 63.231 kg (139 lb 6.4 oz)  06/20/13 63.64 kg (140 lb 4.8 oz)      Studies/Labs Reviewed:   EKG:  EKG is ordered today.  The ekg ordered today demonstrates AV sequential pacing. QTC 490 ms     ASSESSMENT:    1. Complete heart block - dual-chamber pacemaker implanted 2010, generator change August 2014 Medtronic adapta   2. Pacemaker dependent secondary to complete heart block   3. Paroxysmal atrial fibrillation (HCC)   4. Nonsustained ventricular tachycardia ([place])   5. Essential hypertension   6. Long term current use of anticoagulant      PLAN:  In order of problems listed above:  1. CHB: Pacemaker dependent 2. PPM: Normal device function. Remote download in 3 months and office visit in 6 months 3. AFib: Recent increase in burden of atrial fibrillation, asymptomatic. No overt underlying reason. The arrhythmia seems to have subsided in the month of June. CHADSVasc 4 (age 66, gender, HTN). 4. NSVT: Relatively lengthy episodes recorded by her pacemaker in the past, none recently 5. HTN: Well-controlled 6. Need to get updated labs to make sure that the current dose of anticoagulant is appropriate    Medication Adjustments/Labs and Tests Ordered: Current medicines are reviewed at length with the patient today.  Concerns regarding medicines are outlined above.  Medication changes, Labs and Tests ordered today are listed in the Patient Instructions below. Patient Instructions  Dr  Sallyanne Kuster recommends that you continue on your current medications as directed. Please refer to the Current Medication list given to you today.  Remote monitoring is used to monitor your Pacemaker of ICD from home. This monitoring reduces the number of office visits required to check your device to one time per year. It allows [place] to keep an eye on the functioning of your device to ensure it is working properly. You are scheduled for a device check from home on Friday, September 8th, 2017. You may send your transmission at any time that day. If you have a wireless device, the transmission will be sent automatically. After your physician reviews your transmission, you will receive a postcard with your next transmission date.   Dr Sallyanne Kuster recommends that you schedule a follow-up appointment in 6 months with a pacemaker check. You will receive a reminder letter in the mail two months in advance. If you don't receive a letter, please call our office to schedule  the follow-up appointment.  If you need a refill on your cardiac medications before your next appointment, please call your pharmacy.    Signed, Sanda Klein, MD  07/16/2015 7:21 PM    [address] [address], [place], [place]  [address] Phone: [phone]; Fax: [phone]

## 2015-07-20 LAB — CUP PACEART INCLINIC DEVICE CHECK
Battery Remaining Longevity: 134 mo
Brady Statistic AP VP Percent: 90 %
Brady Statistic AS VP Percent: 10 %
Brady Statistic AS VS Percent: 0 %
Implantable Lead Implant Date: 20100511
Implantable Lead Location: 753859
Implantable Lead Location: 753860
Implantable Lead Serial Number: 640302
Lead Channel Impedance Value: 603 Ohm
Lead Channel Impedance Value: 642 Ohm
Lead Channel Pacing Threshold Amplitude: 0.625 V
Lead Channel Pacing Threshold Pulse Width: 0.4 ms
Lead Channel Setting Pacing Amplitude: 1.5 V
MDC IDC LEAD IMPLANT DT: 20100511
MDC IDC LEAD SERIAL: 525535
MDC IDC MSMT BATTERY IMPEDANCE: 157 Ohm
MDC IDC MSMT BATTERY VOLTAGE: 2.78 V
MDC IDC MSMT LEADCHNL RA PACING THRESHOLD AMPLITUDE: 0.25 V
MDC IDC MSMT LEADCHNL RA PACING THRESHOLD PULSEWIDTH: 0.4 ms
MDC IDC SESS DTM: 20170609152831
MDC IDC SET LEADCHNL RV PACING AMPLITUDE: 2 V
MDC IDC SET LEADCHNL RV PACING PULSEWIDTH: 0.4 ms
MDC IDC SET LEADCHNL RV SENSING SENSITIVITY: 2 mV
MDC IDC STAT BRADY AP VS PERCENT: 0 %

## 2015-07-22 ENCOUNTER — Encounter: Payer: Self-pay | Admitting: Cardiovascular Disease

## 2015-07-27 ENCOUNTER — Other Ambulatory Visit: Payer: Self-pay | Admitting: Cardiovascular Disease

## 2015-07-27 NOTE — Telephone Encounter (Signed)
Rx has been sent to the pharmacy electronically. ° °

## 2015-10-15 ENCOUNTER — Ambulatory Visit (INDEPENDENT_AMBULATORY_CARE_PROVIDER_SITE_OTHER): Payer: Medicare Other | Admitting: *Deleted

## 2015-10-15 ENCOUNTER — Telehealth: Payer: Self-pay | Admitting: Cardiology

## 2015-10-15 DIAGNOSIS — I442 Atrioventricular block, complete: Secondary | ICD-10-CM | POA: Diagnosis not present

## 2015-10-15 DIAGNOSIS — Z950 Presence of cardiac pacemaker: Secondary | ICD-10-CM

## 2015-10-15 NOTE — Telephone Encounter (Signed)
Spoke with pt and reminded pt of remote transmission that is due today. Pt verbalized understanding.   

## 2015-10-18 ENCOUNTER — Encounter: Payer: Self-pay | Admitting: Cardiology

## 2015-10-18 NOTE — Progress Notes (Signed)
Remote pacemaker transmission.   

## 2015-10-25 LAB — CUP PACEART REMOTE DEVICE CHECK
Battery Impedance: 157 Ohm
Brady Statistic AP VS Percent: 0 %
Brady Statistic AS VP Percent: 10 %
Date Time Interrogation Session: 20170908190124
Implantable Lead Implant Date: 20100511
Implantable Lead Model: 4469
Implantable Lead Model: 4470
Implantable Lead Serial Number: 525535
Lead Channel Pacing Threshold Amplitude: 0.25 V
Lead Channel Pacing Threshold Pulse Width: 0.4 ms
Lead Channel Setting Pacing Amplitude: 2.5 V
MDC IDC LEAD IMPLANT DT: 20100511
MDC IDC LEAD LOCATION: 753859
MDC IDC LEAD LOCATION: 753860
MDC IDC LEAD SERIAL: 640302
MDC IDC MSMT BATTERY REMAINING LONGEVITY: 121 mo
MDC IDC MSMT BATTERY VOLTAGE: 2.78 V
MDC IDC MSMT LEADCHNL RA IMPEDANCE VALUE: 584 Ohm
MDC IDC MSMT LEADCHNL RV IMPEDANCE VALUE: 611 Ohm
MDC IDC MSMT LEADCHNL RV PACING THRESHOLD AMPLITUDE: 1.25 V
MDC IDC MSMT LEADCHNL RV PACING THRESHOLD PULSEWIDTH: 0.4 ms
MDC IDC SET LEADCHNL RA PACING AMPLITUDE: 1.5 V
MDC IDC SET LEADCHNL RV PACING PULSEWIDTH: 0.4 ms
MDC IDC SET LEADCHNL RV SENSING SENSITIVITY: 2 mV
MDC IDC STAT BRADY AP VP PERCENT: 90 %
MDC IDC STAT BRADY AS VS PERCENT: 0 %

## 2016-01-17 ENCOUNTER — Telehealth: Payer: Self-pay | Admitting: Cardiology

## 2016-01-17 ENCOUNTER — Ambulatory Visit (INDEPENDENT_AMBULATORY_CARE_PROVIDER_SITE_OTHER): Payer: Medicare Other | Admitting: *Deleted

## 2016-01-17 DIAGNOSIS — I442 Atrioventricular block, complete: Secondary | ICD-10-CM | POA: Diagnosis not present

## 2016-01-17 NOTE — Telephone Encounter (Signed)
Spoke with pt and reminded pt of remote transmission that is due today. Pt verbalized understanding.   

## 2016-01-18 NOTE — Progress Notes (Signed)
Remote pacemaker transmission.   

## 2016-01-20 DIAGNOSIS — H401132 Primary open-angle glaucoma, bilateral, moderate stage: Secondary | ICD-10-CM | POA: Diagnosis not present

## 2016-01-21 ENCOUNTER — Encounter: Payer: Self-pay | Admitting: Cardiology

## 2016-01-21 LAB — CUP PACEART REMOTE DEVICE CHECK
Battery Impedance: 157 Ohm
Brady Statistic AP VS Percent: 0 %
Brady Statistic AS VS Percent: 0 %
Date Time Interrogation Session: 20171211194015
Implantable Lead Implant Date: 20100511
Implantable Lead Location: 753859
Implantable Lead Location: 753860
Implantable Lead Model: 4469
Implantable Lead Serial Number: 640302
Lead Channel Impedance Value: 609 Ohm
Lead Channel Pacing Threshold Amplitude: 0.625 V
Lead Channel Pacing Threshold Pulse Width: 0.4 ms
Lead Channel Setting Pacing Amplitude: 2 V
Lead Channel Setting Sensing Sensitivity: 2 mV
MDC IDC LEAD IMPLANT DT: 20100511
MDC IDC LEAD SERIAL: 525535
MDC IDC MSMT BATTERY REMAINING LONGEVITY: 132 mo
MDC IDC MSMT BATTERY VOLTAGE: 2.78 V
MDC IDC MSMT LEADCHNL RA IMPEDANCE VALUE: 582 Ohm
MDC IDC MSMT LEADCHNL RA PACING THRESHOLD AMPLITUDE: 0.25 V
MDC IDC MSMT LEADCHNL RV PACING THRESHOLD PULSEWIDTH: 0.4 ms
MDC IDC PG IMPLANT DT: 20140801
MDC IDC SET LEADCHNL RA PACING AMPLITUDE: 1.5 V
MDC IDC SET LEADCHNL RV PACING PULSEWIDTH: 0.4 ms
MDC IDC STAT BRADY AP VP PERCENT: 91 %
MDC IDC STAT BRADY AS VP PERCENT: 9 %

## 2016-03-02 DIAGNOSIS — I10 Essential (primary) hypertension: Secondary | ICD-10-CM | POA: Diagnosis not present

## 2016-03-02 DIAGNOSIS — R8290 Unspecified abnormal findings in urine: Secondary | ICD-10-CM | POA: Diagnosis not present

## 2016-03-02 DIAGNOSIS — E784 Other hyperlipidemia: Secondary | ICD-10-CM | POA: Diagnosis not present

## 2016-03-09 ENCOUNTER — Telehealth: Payer: Self-pay | Admitting: Cardiovascular Disease

## 2016-03-09 NOTE — Telephone Encounter (Signed)
Recommend Delsym (generic) cough syrup.  Works for 12 hours, so only have to dose twice daily.   It will be fine with Tamiflu, metoprolol and Xarelto

## 2016-03-09 NOTE — Telephone Encounter (Signed)
Spoke to daughter,information given by pharmacist verbalized understanding.

## 2016-03-09 NOTE — Telephone Encounter (Signed)
Forward/defer to pharmacist for suggestion

## 2016-03-09 NOTE — Telephone Encounter (Signed)
Toni Sanders (patient's daughter) is calling on behalf of [name]. She states that the patient has the flu and is taking tamiflu. [name] would like to know if there is a cough syrup that would work in conjunction with the tamiflu but wont interfere with metoprolol and xarelto. Please call at [phone]. Thanks.

## 2016-03-17 DIAGNOSIS — R05 Cough: Secondary | ICD-10-CM | POA: Diagnosis not present

## 2016-03-17 DIAGNOSIS — J111 Influenza due to unidentified influenza virus with other respiratory manifestations: Secondary | ICD-10-CM | POA: Diagnosis not present

## 2016-03-17 DIAGNOSIS — Z6825 Body mass index (BMI) 25.0-25.9, adult: Secondary | ICD-10-CM | POA: Diagnosis not present

## 2016-03-24 DIAGNOSIS — Z6825 Body mass index (BMI) 25.0-25.9, adult: Secondary | ICD-10-CM | POA: Diagnosis not present

## 2016-03-24 DIAGNOSIS — R05 Cough: Secondary | ICD-10-CM | POA: Diagnosis not present

## 2016-03-24 DIAGNOSIS — J449 Chronic obstructive pulmonary disease, unspecified: Secondary | ICD-10-CM | POA: Diagnosis not present

## 2016-03-24 DIAGNOSIS — J019 Acute sinusitis, unspecified: Secondary | ICD-10-CM | POA: Diagnosis not present

## 2016-04-17 ENCOUNTER — Ambulatory Visit (INDEPENDENT_AMBULATORY_CARE_PROVIDER_SITE_OTHER): Payer: Medicare Other | Admitting: *Deleted

## 2016-04-17 DIAGNOSIS — I442 Atrioventricular block, complete: Secondary | ICD-10-CM

## 2016-04-18 NOTE — Progress Notes (Signed)
Remote pacemaker transmission.   

## 2016-04-19 ENCOUNTER — Encounter: Payer: Self-pay | Admitting: Cardiology

## 2016-04-19 LAB — CUP PACEART REMOTE DEVICE CHECK
Brady Statistic AP VS Percent: 0 %
Brady Statistic AS VS Percent: 0 %
Implantable Lead Location: 753860
Implantable Lead Model: 4469
Implantable Lead Model: 4470
Implantable Lead Serial Number: 640302
Lead Channel Impedance Value: 609 Ohm
Lead Channel Pacing Threshold Amplitude: 0.625 V
Lead Channel Pacing Threshold Pulse Width: 0.4 ms
Lead Channel Pacing Threshold Pulse Width: 0.4 ms
MDC IDC LEAD IMPLANT DT: 20100511
MDC IDC LEAD IMPLANT DT: 20100511
MDC IDC LEAD LOCATION: 753859
MDC IDC LEAD SERIAL: 525535
MDC IDC MSMT BATTERY IMPEDANCE: 157 Ohm
MDC IDC MSMT BATTERY REMAINING LONGEVITY: 132 mo
MDC IDC MSMT BATTERY VOLTAGE: 2.78 V
MDC IDC MSMT LEADCHNL RA IMPEDANCE VALUE: 592 Ohm
MDC IDC MSMT LEADCHNL RA PACING THRESHOLD AMPLITUDE: 0.25 V
MDC IDC PG IMPLANT DT: 20140801
MDC IDC SESS DTM: 20180312142540
MDC IDC SET LEADCHNL RA PACING AMPLITUDE: 1.5 V
MDC IDC SET LEADCHNL RV PACING AMPLITUDE: 2 V
MDC IDC SET LEADCHNL RV PACING PULSEWIDTH: 0.4 ms
MDC IDC SET LEADCHNL RV SENSING SENSITIVITY: 2 mV
MDC IDC STAT BRADY AP VP PERCENT: 91 %
MDC IDC STAT BRADY AS VP PERCENT: 9 %

## 2016-05-03 DIAGNOSIS — H903 Sensorineural hearing loss, bilateral: Secondary | ICD-10-CM | POA: Diagnosis not present

## 2016-05-03 DIAGNOSIS — Z822 Family history of deafness and hearing loss: Secondary | ICD-10-CM | POA: Diagnosis not present

## 2016-05-08 DIAGNOSIS — D692 Other nonthrombocytopenic purpura: Secondary | ICD-10-CM | POA: Diagnosis not present

## 2016-05-08 DIAGNOSIS — M40209 Unspecified kyphosis, site unspecified: Secondary | ICD-10-CM | POA: Diagnosis not present

## 2016-05-08 DIAGNOSIS — Z0000 Encounter for general adult medical examination without abnormal findings: Secondary | ICD-10-CM | POA: Diagnosis not present

## 2016-05-08 DIAGNOSIS — J449 Chronic obstructive pulmonary disease, unspecified: Secondary | ICD-10-CM | POA: Diagnosis not present

## 2016-06-07 DIAGNOSIS — L821 Other seborrheic keratosis: Secondary | ICD-10-CM | POA: Diagnosis not present

## 2016-06-07 DIAGNOSIS — Z85828 Personal history of other malignant neoplasm of skin: Secondary | ICD-10-CM | POA: Diagnosis not present

## 2016-07-28 ENCOUNTER — Other Ambulatory Visit: Payer: Self-pay | Admitting: Cardiovascular Disease

## 2016-08-01 DIAGNOSIS — H04123 Dry eye syndrome of bilateral lacrimal glands: Secondary | ICD-10-CM | POA: Diagnosis not present

## 2016-08-01 DIAGNOSIS — H25812 Combined forms of age-related cataract, left eye: Secondary | ICD-10-CM | POA: Diagnosis not present

## 2016-08-01 DIAGNOSIS — H401132 Primary open-angle glaucoma, bilateral, moderate stage: Secondary | ICD-10-CM | POA: Diagnosis not present

## 2016-08-29 ENCOUNTER — Other Ambulatory Visit: Payer: Self-pay | Admitting: Cardiovascular Disease

## 2016-08-30 ENCOUNTER — Encounter: Payer: Self-pay | Admitting: Cardiovascular Disease

## 2016-08-30 ENCOUNTER — Ambulatory Visit (INDEPENDENT_AMBULATORY_CARE_PROVIDER_SITE_OTHER): Payer: Medicare Other | Admitting: Cardiovascular Disease

## 2016-08-30 VITALS — BP 128/72 | HR 78 | Ht 63.0 in | Wt 140.0 lb

## 2016-08-30 DIAGNOSIS — Z7901 Long term (current) use of anticoagulants: Secondary | ICD-10-CM

## 2016-08-30 DIAGNOSIS — Z950 Presence of cardiac pacemaker: Secondary | ICD-10-CM

## 2016-08-30 DIAGNOSIS — I4729 Other ventricular tachycardia: Secondary | ICD-10-CM

## 2016-08-30 DIAGNOSIS — I480 Paroxysmal atrial fibrillation: Secondary | ICD-10-CM

## 2016-08-30 DIAGNOSIS — I442 Atrioventricular block, complete: Secondary | ICD-10-CM | POA: Diagnosis not present

## 2016-08-30 DIAGNOSIS — I10 Essential (primary) hypertension: Secondary | ICD-10-CM

## 2016-08-30 DIAGNOSIS — I472 Ventricular tachycardia: Secondary | ICD-10-CM | POA: Diagnosis not present

## 2016-08-30 NOTE — Progress Notes (Signed)
Cardiology Office Note    Date:  08/30/2016   ID:  Toni Sanders, Sanders 1922/04/11, MRN [id number]  PCP:  Burnard Bunting, MD  Cardiologist:   Sanda Klein, MD   Chief Complaint  Patient presents with  . Follow-up    pt denied chest pain, pt c/o SOB on exertion    History of Present Illness:  Toni Sanders Sanders is a 83 y.o. female with complete heart block, dual-chamber permanent pacemaker, systemic hypertension, history of paroxysmal atrial flutter and atrial fibrillation and history of rare nonsustained VT, returning for follow-up.  In February she had influenza. Her daughter, who also has diabetes mellitus and her husband, who was rather elderly and frail with cardiac and renal complications both had influenza and both developed pneumonia. Unfortunately her husband passed away from this. They had been married for 96 years.  The patient specifically denies any chest pain at rest exertion, dyspnea at rest or with exertion, orthopnea, paroxysmal nocturnal dyspnea, syncope, palpitations, focal neurological deficits, intermittent claudication, lower extremity edema, unexplained weight gain, cough, hemoptysis or wheezing.  Interrogation of her device shows normal function. Her Medtronic Adapta pacemaker was implanted in 2014 and still has roughly 10 years of longevity. She has 92% atrial pacing and 100% ventricular pacing. The burden of atrial fibrillation is 0.5%, comparable with historical trends. Most of the atrial fibrillation occurred in February around the time of her respiratory illness and her husband's demise. She also had a six-hour episode of atrial fibrillation last June. No ventricular tachycardia has been recorded since 2016.  She has not had any bleeding problems on chronic anticoagulation with Xarelto.  Past Medical History:  Diagnosis Date  . Atrial flutter ([place]) 2010  . Bradycardia 2010  . Complete heart block ([place]) 2010   s/p PPM by Dr Doreatha Lew (MDT) 06/16/08  . H/O  cardiovascular stress test 09/22/10   no ischemia, EF >60%  . LVH (left ventricular hypertrophy) 09/22/10   Echo >55% mild LVH   . Osteoporosis     Past Surgical History:  Procedure Laterality Date  . CHOLECYSTECTOMY  1994  . PACEMAKER GENERATOR CHANGE  09/06/12   new medtronic generator Adapta L secondary to early battery depletion  . PACEMAKER GENERATOR CHANGE N/A 09/06/2012   Procedure: PACEMAKER GENERATOR CHANGE;  Surgeon: Sanda Klein, MD;  Location: [facility];  Service: Cardiovascular;  Laterality: N/A;  . PACEMAKER PLACEMENT  06/16/08   Implantation of dual-chamber Medtronic PPMY by Dr Doreatha Lew    Current Medications: Outpatient Medications Prior to Visit  Medication Sig Dispense Refill  . fish oil-omega-3 fatty acids 1000 MG capsule Take 1 g by mouth daily. 1 tab po qd    . metoprolol succinate (TOPROL-XL) 50 MG 24 hr tablet Take 75 mg (1.5 tablets) by mouth daily. Please keep upcoming appointment 7/25 at 10:20 am for additional refills thanks. 45 tablet 1  . XARELTO 15 MG TABS tablet TAKE ONE TABLET BY MOUTH ONCE DAILY WITHSUPPER 30 tablet 0  . aspirin 81 MG tablet 1 tab when pt remebers     . latanoprost (XALATAN) 0.005 % ophthalmic solution Place 1 drop into both eyes daily. At bedtime  1   No facility-administered medications prior to visit.      Allergies:   Anesthetics, amide; Anesthetics, ester; Anesthetics, halogenated; and Morphine and related   Social History   Social History  . Marital status: Married    Spouse name: N/A  . Number of children: N/A  . Years of education:  N/A   Social History Main Topics  . Smoking status: Never Smoker  . Smokeless tobacco: Never Used  . Alcohol use No  . Drug use: No  . Sexual activity: Not on file   Other Topics Concern  . Not on file   Social History Narrative   Lives with spouse in [place].     Family History:  The patient's family history includes Cancer - Ovarian in her maternal grandmother; Cancer -  Prostate in her father; Hypertension in her unknown relative; Pneumonia in her paternal grandmother.   ROS:   Please see the history of present illness.    ROS All other systems reviewed and are negative.   PHYSICAL EXAM:   VS:  BP 128/72   Pulse 78   Ht 5\' 3"  (1.6 m)   Wt 140 lb (63.5 kg)   BMI 24.80 kg/m    GEN: Well nourished, well developed, in no acute distress  HEENT: normal  Neck: no JVD, carotid bruits, or masses Cardiac: Paradoxically split second heart sound, RRR; no murmurs, rubs, or gallops,no edema , healthy right subclavian pacemaker site Respiratory:  clear to auscultation bilaterally, normal work of breathing GI: soft, nontender, nondistended, + BS MS: no deformity or atrophy  Skin: warm and dry, no rash Neuro:  Alert and Oriented x 3, Strength and sensation are intact Psych: euthymic mood, full affect  Wt Readings from Last 3 Encounters:  08/30/16 140 lb (63.5 kg)  07/16/15 135 lb (61.2 kg)  07/07/14 139 lb 6.4 oz (63.2 kg)      Studies/Labs Reviewed:   EKG:  EKG is ordered today.  The ekg ordered today demonstrates AV sequential pacing. QTC 481 ms.   ASSESSMENT:    1. Complete heart block ([place])   2. S/P cardiac pacemaker procedure      PLAN:  In order of problems listed above:  1. CHB: Pacemaker dependent 2. PPM: Normal device function. Remote download in 3 months and office visit in 6 months 3. AFib: The arrhythmia remains asymptomatic, rate control was not an issue due to AV block and antiarrhythmics are not indicated. Appropriately anticoagulated CHADSVasc 4 (age 64, gender, HTN). 4. NSVT: Relatively lengthy episodes recorded by her pacemaker in 2016, but none recently. Continue beta blocker 5. HTN: Well-controlled on current meds. 6. Xarelto: No bleeding complications    Medication Adjustments/Labs and Tests Ordered: Current medicines are reviewed at length with the patient today.  Concerns regarding medicines are outlined above.   Medication changes, Labs and Tests ordered today are listed in the Patient Instructions below. Patient Instructions  Dr Sallyanne Kuster recommends that you continue on your current medications as directed. Please refer to the Current Medication list given to you today.  Remote monitoring is used to monitor your Pacemaker or ICD from home. This monitoring reduces the number of office visits required to check your device to one time per year. It allows [place] to keep an eye on the functioning of your device to ensure it is working properly. You are scheduled for a device check from home on Wednesday, October 24th, 2018. You may send your transmission at any time that day. If you have a wireless device, the transmission will be sent automatically. After your physician reviews your transmission, you will receive a notification with your next transmission date.  Dr Sallyanne Kuster recommends that you schedule a follow-up appointment in 12 months with a pacemaker check. You will receive a reminder letter in the mail two months in advance. If  you don't receive a letter, please call our office to schedule the follow-up appointment.  If you need a refill on your cardiac medications before your next appointment, please call your pharmacy.    Signed, Sanda Klein, MD  08/30/2016 10:57 AM    [facility] [address], [place], [place]  [address] Phone: [phone]; Fax: [phone]

## 2016-08-30 NOTE — Patient Instructions (Signed)
Dr Croitoru recommends that you continue on your current medications as directed. Please refer to the Current Medication list given to you today.  Remote monitoring is used to monitor your Pacemaker or ICD from home. This monitoring reduces the number of office visits required to check your device to one time per year. It allows us to keep an eye on the functioning of your device to ensure it is working properly. You are scheduled for a device check from home on Wednesday, October 24th, 2018. You may send your transmission at any time that day. If you have a wireless device, the transmission will be sent automatically. After your physician reviews your transmission, you will receive a notification with your next transmission date.  Dr Croitoru recommends that you schedule a follow-up appointment in 12 months with a pacemaker check. You will receive a reminder letter in the mail two months in advance. If you don't receive a letter, please call our office to schedule the follow-up appointment.  If you need a refill on your cardiac medications before your next appointment, please call your pharmacy. 

## 2016-08-30 NOTE — Telephone Encounter (Signed)
Blood work requested to [place]

## 2016-09-29 ENCOUNTER — Other Ambulatory Visit: Payer: Self-pay | Admitting: Cardiovascular Disease

## 2016-11-15 DIAGNOSIS — I10 Essential (primary) hypertension: Secondary | ICD-10-CM | POA: Diagnosis not present

## 2016-11-15 DIAGNOSIS — I129 Hypertensive chronic kidney disease with stage 1 through stage 4 chronic kidney disease, or unspecified chronic kidney disease: Secondary | ICD-10-CM | POA: Diagnosis not present

## 2016-11-15 DIAGNOSIS — I480 Paroxysmal atrial fibrillation: Secondary | ICD-10-CM | POA: Diagnosis not present

## 2016-11-15 DIAGNOSIS — J449 Chronic obstructive pulmonary disease, unspecified: Secondary | ICD-10-CM | POA: Diagnosis not present

## 2016-11-27 ENCOUNTER — Other Ambulatory Visit: Payer: Self-pay | Admitting: Cardiovascular Disease

## 2016-11-29 ENCOUNTER — Telehealth: Payer: Self-pay | Admitting: Cardiology

## 2016-11-29 ENCOUNTER — Ambulatory Visit (INDEPENDENT_AMBULATORY_CARE_PROVIDER_SITE_OTHER): Payer: Medicare Other | Admitting: *Deleted

## 2016-11-29 DIAGNOSIS — I442 Atrioventricular block, complete: Secondary | ICD-10-CM

## 2016-11-29 NOTE — Progress Notes (Signed)
Remote pacemaker transmission.   

## 2016-11-29 NOTE — Telephone Encounter (Signed)
Confirmed remote transmission w/ pt daughter.   

## 2016-11-30 LAB — CUP PACEART REMOTE DEVICE CHECK
Battery Remaining Longevity: 119 mo
Battery Voltage: 2.78 V
Implantable Lead Implant Date: 20100511
Implantable Lead Location: 753859
Implantable Lead Location: 753860
Implantable Lead Model: 4470
Implantable Lead Serial Number: 525535
Implantable Pulse Generator Implant Date: 20140801
Lead Channel Pacing Threshold Pulse Width: 0.4 ms
Lead Channel Setting Pacing Amplitude: 1.5 V
Lead Channel Setting Pacing Amplitude: 2 V
Lead Channel Setting Pacing Pulse Width: 0.4 ms
Lead Channel Setting Sensing Sensitivity: 2 mV
MDC IDC LEAD IMPLANT DT: 20100511
MDC IDC LEAD SERIAL: 640302
MDC IDC MSMT BATTERY IMPEDANCE: 228 Ohm
MDC IDC MSMT LEADCHNL RA IMPEDANCE VALUE: 584 Ohm
MDC IDC MSMT LEADCHNL RA PACING THRESHOLD AMPLITUDE: 0.25 V
MDC IDC MSMT LEADCHNL RA PACING THRESHOLD PULSEWIDTH: 0.4 ms
MDC IDC MSMT LEADCHNL RV IMPEDANCE VALUE: 612 Ohm
MDC IDC MSMT LEADCHNL RV PACING THRESHOLD AMPLITUDE: 0.5 V
MDC IDC SESS DTM: 20181024193450
MDC IDC STAT BRADY AP VP PERCENT: 97 %
MDC IDC STAT BRADY AP VS PERCENT: 0 %
MDC IDC STAT BRADY AS VP PERCENT: 3 %
MDC IDC STAT BRADY AS VS PERCENT: 0 %

## 2016-12-01 ENCOUNTER — Encounter: Payer: Self-pay | Admitting: Cardiology

## 2016-12-20 DIAGNOSIS — Z6828 Body mass index (BMI) 28.0-28.9, adult: Secondary | ICD-10-CM | POA: Diagnosis not present

## 2016-12-20 DIAGNOSIS — J449 Chronic obstructive pulmonary disease, unspecified: Secondary | ICD-10-CM | POA: Diagnosis not present

## 2016-12-20 DIAGNOSIS — J019 Acute sinusitis, unspecified: Secondary | ICD-10-CM | POA: Diagnosis not present

## 2016-12-26 ENCOUNTER — Other Ambulatory Visit: Payer: Self-pay | Admitting: Cardiovascular Disease

## 2017-01-23 DIAGNOSIS — Z23 Encounter for immunization: Secondary | ICD-10-CM | POA: Diagnosis not present

## 2017-02-22 DIAGNOSIS — R05 Cough: Secondary | ICD-10-CM | POA: Diagnosis not present

## 2017-02-22 DIAGNOSIS — I10 Essential (primary) hypertension: Secondary | ICD-10-CM | POA: Diagnosis not present

## 2017-02-22 DIAGNOSIS — J069 Acute upper respiratory infection, unspecified: Secondary | ICD-10-CM | POA: Diagnosis not present

## 2017-02-22 DIAGNOSIS — Z6827 Body mass index (BMI) 27.0-27.9, adult: Secondary | ICD-10-CM | POA: Diagnosis not present

## 2017-02-28 ENCOUNTER — Telehealth: Payer: Self-pay | Admitting: Cardiology

## 2017-02-28 ENCOUNTER — Ambulatory Visit (INDEPENDENT_AMBULATORY_CARE_PROVIDER_SITE_OTHER): Payer: Medicare Other | Admitting: *Deleted

## 2017-02-28 DIAGNOSIS — I442 Atrioventricular block, complete: Secondary | ICD-10-CM | POA: Diagnosis not present

## 2017-02-28 NOTE — Telephone Encounter (Signed)
Confirmed remote transmission w/ pt daughter.   

## 2017-02-28 NOTE — Progress Notes (Signed)
Remote pacemaker transmission.   

## 2017-03-01 ENCOUNTER — Encounter: Payer: Self-pay | Admitting: Cardiology

## 2017-03-01 NOTE — Progress Notes (Signed)
Letter  

## 2017-03-13 LAB — CUP PACEART REMOTE DEVICE CHECK
Battery Impedance: 228 Ohm
Battery Remaining Longevity: 119 mo
Brady Statistic AP VP Percent: 95 %
Brady Statistic AS VP Percent: 5 %
Brady Statistic AS VS Percent: 0 %
Date Time Interrogation Session: 20190123202044
Implantable Lead Implant Date: 20100511
Implantable Lead Location: 753859
Implantable Lead Model: 4469
Implantable Lead Model: 4470
Implantable Pulse Generator Implant Date: 20140801
Lead Channel Impedance Value: 590 Ohm
Lead Channel Impedance Value: 601 Ohm
Lead Channel Pacing Threshold Amplitude: 0.25 V
Lead Channel Pacing Threshold Amplitude: 0.625 V
Lead Channel Setting Pacing Amplitude: 1.5 V
Lead Channel Setting Pacing Amplitude: 2 V
MDC IDC LEAD IMPLANT DT: 20100511
MDC IDC LEAD LOCATION: 753860
MDC IDC LEAD SERIAL: 525535
MDC IDC LEAD SERIAL: 640302
MDC IDC MSMT BATTERY VOLTAGE: 2.78 V
MDC IDC MSMT LEADCHNL RA PACING THRESHOLD PULSEWIDTH: 0.4 ms
MDC IDC MSMT LEADCHNL RV PACING THRESHOLD PULSEWIDTH: 0.4 ms
MDC IDC SET LEADCHNL RV PACING PULSEWIDTH: 0.4 ms
MDC IDC SET LEADCHNL RV SENSING SENSITIVITY: 2 mV
MDC IDC STAT BRADY AP VS PERCENT: 0 %

## 2017-03-26 ENCOUNTER — Other Ambulatory Visit: Payer: Self-pay | Admitting: Cardiovascular Disease

## 2017-04-09 LAB — CUP PACEART INCLINIC DEVICE CHECK
Date Time Interrogation Session: 20190304141135
Implantable Lead Implant Date: 20100511
Implantable Lead Location: 753860
Implantable Lead Model: 4469
Implantable Lead Serial Number: 525535
MDC IDC LEAD IMPLANT DT: 20100511
MDC IDC LEAD LOCATION: 753859
MDC IDC LEAD SERIAL: 640302
MDC IDC PG IMPLANT DT: 20140801

## 2017-04-23 ENCOUNTER — Other Ambulatory Visit: Payer: Self-pay | Admitting: Cardiovascular Disease

## 2017-05-07 DIAGNOSIS — I10 Essential (primary) hypertension: Secondary | ICD-10-CM | POA: Diagnosis not present

## 2017-05-07 DIAGNOSIS — E7849 Other hyperlipidemia: Secondary | ICD-10-CM | POA: Diagnosis not present

## 2017-05-14 DIAGNOSIS — Z0000 Encounter for general adult medical examination without abnormal findings: Secondary | ICD-10-CM | POA: Diagnosis not present

## 2017-05-14 DIAGNOSIS — I10 Essential (primary) hypertension: Secondary | ICD-10-CM | POA: Diagnosis not present

## 2017-05-14 DIAGNOSIS — E7849 Other hyperlipidemia: Secondary | ICD-10-CM | POA: Diagnosis not present

## 2017-05-14 DIAGNOSIS — I480 Paroxysmal atrial fibrillation: Secondary | ICD-10-CM | POA: Diagnosis not present

## 2017-05-21 DIAGNOSIS — Z1212 Encounter for screening for malignant neoplasm of rectum: Secondary | ICD-10-CM | POA: Diagnosis not present

## 2017-05-23 ENCOUNTER — Other Ambulatory Visit: Payer: Self-pay | Admitting: Cardiovascular Disease

## 2017-05-30 ENCOUNTER — Telehealth: Payer: Self-pay | Admitting: Cardiology

## 2017-05-30 ENCOUNTER — Ambulatory Visit (INDEPENDENT_AMBULATORY_CARE_PROVIDER_SITE_OTHER): Payer: Medicare Other | Admitting: *Deleted

## 2017-05-30 ENCOUNTER — Telehealth: Payer: Self-pay | Admitting: *Deleted

## 2017-05-30 DIAGNOSIS — I442 Atrioventricular block, complete: Secondary | ICD-10-CM

## 2017-05-30 NOTE — Progress Notes (Signed)
Remote pacemaker transmission.   

## 2017-05-30 NOTE — Telephone Encounter (Signed)
Attempted to confirm remote transmission with pt. No answer and was unable to leave a message.   

## 2017-05-30 NOTE — Telephone Encounter (Signed)
Spoke with Ms. Kupfer and her daughter- assisted with transmission. Received successfully and scheduled with Dr. Sallyanne Kuster 08/29/17.

## 2017-06-01 ENCOUNTER — Encounter: Payer: Self-pay | Admitting: Cardiology

## 2017-06-01 LAB — CUP PACEART REMOTE DEVICE CHECK
Battery Impedance: 276 Ohm
Battery Voltage: 2.78 V
Brady Statistic AP VS Percent: 0 %
Brady Statistic AS VP Percent: 5 %
Brady Statistic AS VS Percent: 0 %
Implantable Lead Implant Date: 20100511
Implantable Lead Location: 753859
Implantable Lead Location: 753860
Implantable Lead Model: 4469
Implantable Lead Model: 4470
Implantable Lead Serial Number: 525535
Implantable Lead Serial Number: 640302
Lead Channel Impedance Value: 618 Ohm
Lead Channel Pacing Threshold Amplitude: 0.25 V
Lead Channel Pacing Threshold Pulse Width: 0.4 ms
Lead Channel Pacing Threshold Pulse Width: 0.4 ms
Lead Channel Setting Pacing Amplitude: 2 V
Lead Channel Setting Sensing Sensitivity: 2 mV
MDC IDC LEAD IMPLANT DT: 20100511
MDC IDC MSMT BATTERY REMAINING LONGEVITY: 113 mo
MDC IDC MSMT LEADCHNL RA IMPEDANCE VALUE: 601 Ohm
MDC IDC MSMT LEADCHNL RV PACING THRESHOLD AMPLITUDE: 0.5 V
MDC IDC PG IMPLANT DT: 20140801
MDC IDC SESS DTM: 20190424195600
MDC IDC SET LEADCHNL RA PACING AMPLITUDE: 1.5 V
MDC IDC SET LEADCHNL RV PACING PULSEWIDTH: 0.4 ms
MDC IDC STAT BRADY AP VP PERCENT: 95 %

## 2017-08-29 ENCOUNTER — Telehealth: Payer: Self-pay | Admitting: Cardiology

## 2017-08-29 ENCOUNTER — Encounter: Payer: Medicare Other | Admitting: Cardiovascular Disease

## 2017-08-29 ENCOUNTER — Ambulatory Visit (INDEPENDENT_AMBULATORY_CARE_PROVIDER_SITE_OTHER): Payer: Medicare Other | Admitting: *Deleted

## 2017-08-29 DIAGNOSIS — I442 Atrioventricular block, complete: Secondary | ICD-10-CM | POA: Diagnosis not present

## 2017-08-29 NOTE — Telephone Encounter (Signed)
Spoke with pt and reminded pt of remote transmission that is due today. Pt verbalized understanding.   

## 2017-08-30 ENCOUNTER — Encounter: Payer: Self-pay | Admitting: Cardiology

## 2017-08-30 NOTE — Progress Notes (Signed)
Remote pacemaker transmission.   

## 2017-08-30 NOTE — Progress Notes (Signed)
Letter  

## 2017-10-01 DIAGNOSIS — N644 Mastodynia: Secondary | ICD-10-CM | POA: Diagnosis not present

## 2017-10-05 ENCOUNTER — Ambulatory Visit: Payer: Medicare Other | Admitting: Cardiovascular Disease

## 2017-10-05 ENCOUNTER — Encounter: Payer: Self-pay | Admitting: Cardiovascular Disease

## 2017-10-05 VITALS — BP 118/70 | HR 77 | Ht 63.0 in | Wt 138.0 lb

## 2017-10-05 DIAGNOSIS — I472 Ventricular tachycardia: Secondary | ICD-10-CM

## 2017-10-05 DIAGNOSIS — I480 Paroxysmal atrial fibrillation: Secondary | ICD-10-CM

## 2017-10-05 DIAGNOSIS — Z950 Presence of cardiac pacemaker: Secondary | ICD-10-CM | POA: Diagnosis not present

## 2017-10-05 DIAGNOSIS — I442 Atrioventricular block, complete: Secondary | ICD-10-CM | POA: Diagnosis not present

## 2017-10-05 DIAGNOSIS — Z7901 Long term (current) use of anticoagulants: Secondary | ICD-10-CM

## 2017-10-05 DIAGNOSIS — I4729 Other ventricular tachycardia: Secondary | ICD-10-CM

## 2017-10-05 DIAGNOSIS — I10 Essential (primary) hypertension: Secondary | ICD-10-CM

## 2017-10-05 NOTE — Patient Instructions (Signed)
Dr Sallyanne Kuster recommends that you continue on your current medications as directed. Please refer to the Current Medication list given to you today.  Remote monitoring is used to monitor your Pacemaker or ICD from home. This monitoring reduces the number of office visits required to check your device to one time per year. It allows [place] to keep an eye on the functioning of your device to ensure it is working properly. You are scheduled for a device check from home on Wednesday, October 23rd, 2019. You may send your transmission at any time that day. If you have a wireless device, the transmission will be sent automatically. After your physician reviews your transmission, you will receive a notification with your next transmission date.  To improve our patient care and to more adequately follow your device, [facility] has decided, as a practice, to start following each patient four times a year with your home monitor. This means that you may experience a remote appointment that is close to an in-office appointment with your physician. Your insurance will apply at the same rate as other remote monitoring transmissions.  Dr Sallyanne Kuster recommends that you schedule a follow-up appointment in 12 months with a pacemaker check. You will receive a reminder letter in the mail two months in advance. If you don't receive a letter, please call our office to schedule the follow-up appointment.  If you need a refill on your cardiac medications before your next appointment, please call your pharmacy.

## 2017-10-05 NOTE — Progress Notes (Signed)
Cardiology Office Note    Date:  10/05/2017   ID:  [name], [name] [birth year], MRN [id number]  PCP:  Burnard Bunting, MD  Cardiologist:   Sanda Klein, MD   Chief Complaint  Patient presents with  . Follow-up    pt denied chest pain    History of Present Illness:  Toni Sanders is a 84 y.o. female with complete heart block, dual-chamber permanent pacemaker, systemic hypertension, history of paroxysmal atrial flutter and atrial fibrillation and history of rare nonsustained VT, returning for follow-up.  She has had a rough year.  Her husband and her brother passed away and her youngest daughter, who lives in the same household, has been diagnosed with metastatic breast cancer.  She apologizes for not being able to exercise and for not eating as healthy of her diet as usual.  From a physical point of view though she is doing well.  The patient specifically denies any chest pain at rest exertion, dyspnea at rest or with exertion, orthopnea, paroxysmal nocturnal dyspnea, syncope, palpitations, focal neurological deficits, intermittent claudication, lower extremity edema, unexplained weight gain, cough, hemoptysis or wheezing.  She does have easy bruising, but no serious bleeding events.  She is pacemaker dependent due to complete heart block.  Interrogation of her device shows normal function. Her Medtronic Adapta pacemaker was implanted in 2014 and still has roughly 9 years of longevity.  Lead parameters remain excellent.  She has 93% atrial pacing and 100% ventricular pacing. The burden of atrial fibrillation is 0.7%, comparable with historical trends.  Roughly every month or 2 she will have an episode of atrial fibrillation lasting for 6-12 hours, always asymptomatic.  She had one brief episodes of nonsustained ventricular tachycardia in January, also asymptomatic. none since   Past Medical History:  Diagnosis Date  . Atrial flutter ([place]) 2010  . Bradycardia 2010  . Complete  heart block ([place]) 2010   s/p PPM by Dr Doreatha Lew (MDT) 06/16/08  . H/O cardiovascular stress test 09/22/10   no ischemia, EF >60%  . LVH (left ventricular hypertrophy) 09/22/10   Echo >55% mild LVH   . Osteoporosis     Past Surgical History:  Procedure Laterality Date  . CHOLECYSTECTOMY  1994  . PACEMAKER GENERATOR CHANGE  09/06/12   new medtronic generator Adapta L secondary to early battery depletion  . PACEMAKER GENERATOR CHANGE N/A 09/06/2012   Procedure: PACEMAKER GENERATOR CHANGE;  Surgeon: Sanda Klein, MD;  Location: [facility];  Service: Cardiovascular;  Laterality: N/A;  . PACEMAKER PLACEMENT  06/16/08   Implantation of dual-chamber Medtronic PPMY by Dr Doreatha Lew    Current Medications: Outpatient Medications Prior to Visit  Medication Sig Dispense Refill  . fish oil-omega-3 fatty acids 1000 MG capsule Take 1 g by mouth daily. 1 tab po qd    . metoprolol succinate (TOPROL-XL) 50 MG 24 hr tablet TAKE ONE AND ONE-HALF TABLETS DAILY 45 tablet 11  . XARELTO 15 MG TABS tablet TAKE ONE TABLET DAILY WITH SUPPER 30 tablet 0  . XARELTO 15 MG TABS tablet TAKE ONE TABLET DAILY WITH SUPPER 90 tablet 1   No facility-administered medications prior to visit.      Allergies:   Anesthetics, amide; Anesthetics, ester; Anesthetics, halogenated; and Morphine and related   Social History   Socioeconomic History  . Marital status: Married    Spouse name: Not on file  . Number of children: Not on file  . Years of education: Not on file  .  Highest education level: Not on file  Occupational History  . Not on file  Social Needs  . Financial resource strain: Not on file  . Food insecurity:    Worry: Not on file    Inability: Not on file  . Transportation needs:    Medical: Not on file    Non-medical: Not on file  Tobacco Use  . Smoking status: Never Smoker  . Smokeless tobacco: Never Used  Substance and Sexual Activity  . Alcohol use: No  . Drug use: No  . Sexual activity: Not on file    Lifestyle  . Physical activity:    Days per week: Not on file    Minutes per session: Not on file  . Stress: Not on file  Relationships  . Social connections:    Talks on phone: Not on file    Gets together: Not on file    Attends religious service: Not on file    Active member of club or organization: Not on file    Attends meetings of clubs or organizations: Not on file    Relationship status: Not on file  Other Topics Concern  . Not on file  Social History Narrative   Lives with spouse in [place].     Family History:  The patient's family history includes Cancer - Ovarian in her maternal grandmother; Cancer - Prostate in her father; Hypertension in her unknown relative; Pneumonia in her paternal grandmother.   ROS:   Please see the history of present illness.    ROS all other systems are reviewed and are negative   PHYSICAL EXAM:   VS:  BP 118/70   Pulse 77   Ht 5\' 3"  (1.6 m)   Wt 138 lb (62.6 kg)   BMI 24.45 kg/m     General: Alert, oriented x3, no distress, tears up easily Head: no evidence of trauma, PERRL, EOMI, no exophtalmos or lid lag, no myxedema, no xanthelasma; normal ears, nose and oropharynx Neck: normal jugular venous pulsations and no hepatojugular reflux; brisk carotid pulses without delay and no carotid bruits Chest: clear to auscultation, no signs of consolidation by percussion or palpation, normal fremitus, symmetrical and full respiratory excursions Cardiovascular: normal position and quality of the apical impulse, regular rhythm, normal first and second heart sounds, no murmurs, rubs or gallops Abdomen: no tenderness or distention, no masses by palpation, no abnormal pulsatility or arterial bruits, normal bowel sounds, no hepatosplenomegaly Extremities: no clubbing, cyanosis or edema; 2+ radial, ulnar and brachial pulses bilaterally; 2+ right femoral, posterior tibial and dorsalis pedis pulses; 2+ left femoral, posterior tibial and dorsalis pedis  pulses; no subclavian or femoral bruits Neurological: grossly nonfocal Psych: She is clearly sad and teary-eyed when talking about her departed husband and the illness that her daughter is confronting, but does not appear to be overtly depressed   Wt Readings from Last 3 Encounters:  10/05/17 138 lb (62.6 kg)  08/30/16 140 lb (63.5 kg)  07/16/15 135 lb (61.2 kg)      Studies/Labs Reviewed:   EKG:  EKG is ordered today.  The ekg ordered today demonstrates AV sequential pacing, QRS 162 ms, QTC 480 ms  ASSESSMENT:    1. Complete heart block ([place])   2. Pacemaker   3. Paroxysmal atrial fibrillation (H[facility])   4. Nonsustained ventricular tachycardia ([place])   5. Essential hypertension   6. Anticoagulant long-term use      PLAN:  In order of problems listed above:  1. CHB: Pacemaker  dependent.  2. PPM: Normal device function. Remote download in 3 months and office visit in 6 months 3. AFib: Arrhythmia is asymptomatic she does not require medications for rate control due to heart block. Appropriately anticoagulated CHADSVasc 4 (age 59, gender, HTN). 4. NSVT: Relatively lengthy episodes recorded by her pacemaker in 2016, a brief episode in January 2019. Continue beta blocker 5. HTN: Excellent control just on her immediate-dose metoprolol 6. Xarelto: Bruising, no serious bleeding complications    Medication Adjustments/Labs and Tests Ordered: Current medicines are reviewed at length with the patient today.  Concerns regarding medicines are outlined above.  Medication changes, Labs and Tests ordered today are listed in the Patient Instructions below. Patient Instructions  Dr Sallyanne Kuster recommends that you continue on your current medications as directed. Please refer to the Current Medication list given to you today.  Remote monitoring is used to monitor your Pacemaker or ICD from home. This monitoring reduces the number of office visits required to check your device to one time per year. It  allows [place] to keep an eye on the functioning of your device to ensure it is working properly. You are scheduled for a device check from home on Wednesday, October 23rd, 2019. You may send your transmission at any time that day. If you have a wireless device, the transmission will be sent automatically. After your physician reviews your transmission, you will receive a notification with your next transmission date.  To improve our patient care and to more adequately follow your device, [facility] has decided, as a practice, to start following each patient four times a year with your home monitor. This means that you may experience a remote appointment that is close to an in-office appointment with your physician. Your insurance will apply at the same rate as other remote monitoring transmissions.  Dr Sallyanne Kuster recommends that you schedule a follow-up appointment in 12 months with a pacemaker check. You will receive a reminder letter in the mail two months in advance. If you don't receive a letter, please call our office to schedule the follow-up appointment.  If you need a refill on your cardiac medications before your next appointment, please call your pharmacy.    Signed, Sanda Klein, MD  10/05/2017 11:15 AM    [facility] [address], [place], [place]  [address] Phone: [phone]; Fax: [phone]

## 2017-10-08 LAB — CUP PACEART REMOTE DEVICE CHECK
Battery Impedance: 276 Ohm
Brady Statistic AP VP Percent: 93 %
Brady Statistic AP VS Percent: 0 %
Brady Statistic AS VP Percent: 7 %
Brady Statistic AS VS Percent: 0 %
Date Time Interrogation Session: 20190724195628
Implantable Lead Implant Date: 20100511
Implantable Lead Location: 753859
Implantable Lead Model: 4469
Implantable Lead Serial Number: 640302
Lead Channel Impedance Value: 582 Ohm
Lead Channel Impedance Value: 633 Ohm
Lead Channel Pacing Threshold Amplitude: 0.25 V
Lead Channel Pacing Threshold Amplitude: 0.5 V
Lead Channel Pacing Threshold Pulse Width: 0.4 ms
Lead Channel Setting Pacing Amplitude: 2 V
MDC IDC LEAD IMPLANT DT: 20100511
MDC IDC LEAD LOCATION: 753860
MDC IDC LEAD SERIAL: 525535
MDC IDC MSMT BATTERY REMAINING LONGEVITY: 113 mo
MDC IDC MSMT BATTERY VOLTAGE: 2.78 V
MDC IDC MSMT LEADCHNL RV PACING THRESHOLD PULSEWIDTH: 0.4 ms
MDC IDC PG IMPLANT DT: 20140801
MDC IDC SET LEADCHNL RA PACING AMPLITUDE: 1.5 V
MDC IDC SET LEADCHNL RV PACING PULSEWIDTH: 0.4 ms
MDC IDC SET LEADCHNL RV SENSING SENSITIVITY: 2 mV

## 2017-10-29 DIAGNOSIS — L820 Inflamed seborrheic keratosis: Secondary | ICD-10-CM | POA: Diagnosis not present

## 2017-10-29 DIAGNOSIS — L821 Other seborrheic keratosis: Secondary | ICD-10-CM | POA: Diagnosis not present

## 2017-10-29 DIAGNOSIS — Z85828 Personal history of other malignant neoplasm of skin: Secondary | ICD-10-CM | POA: Diagnosis not present

## 2017-11-01 DIAGNOSIS — N6459 Other signs and symptoms in breast: Secondary | ICD-10-CM | POA: Diagnosis not present

## 2017-11-01 DIAGNOSIS — Z803 Family history of malignant neoplasm of breast: Secondary | ICD-10-CM | POA: Diagnosis not present

## 2017-11-14 DIAGNOSIS — E7849 Other hyperlipidemia: Secondary | ICD-10-CM | POA: Diagnosis not present

## 2017-11-14 DIAGNOSIS — I480 Paroxysmal atrial fibrillation: Secondary | ICD-10-CM | POA: Diagnosis not present

## 2017-11-14 DIAGNOSIS — I10 Essential (primary) hypertension: Secondary | ICD-10-CM | POA: Diagnosis not present

## 2017-11-14 DIAGNOSIS — Z950 Presence of cardiac pacemaker: Secondary | ICD-10-CM | POA: Diagnosis not present

## 2017-11-26 ENCOUNTER — Other Ambulatory Visit: Payer: Self-pay | Admitting: Cardiovascular Disease

## 2017-11-28 ENCOUNTER — Telehealth: Payer: Self-pay

## 2017-11-28 ENCOUNTER — Ambulatory Visit (INDEPENDENT_AMBULATORY_CARE_PROVIDER_SITE_OTHER): Payer: Medicare Other | Admitting: *Deleted

## 2017-11-28 DIAGNOSIS — I495 Sick sinus syndrome: Secondary | ICD-10-CM | POA: Diagnosis not present

## 2017-11-28 DIAGNOSIS — I442 Atrioventricular block, complete: Secondary | ICD-10-CM

## 2017-11-28 NOTE — Telephone Encounter (Signed)
Spoke with pt and reminded pt of remote transmission that is due today. Pt verbalized understanding.   

## 2017-11-28 NOTE — Progress Notes (Signed)
Remote pacemaker transmission.   

## 2017-12-05 ENCOUNTER — Other Ambulatory Visit: Payer: Self-pay | Admitting: Cardiovascular Disease

## 2017-12-20 LAB — CUP PACEART REMOTE DEVICE CHECK
Battery Impedance: 325 Ohm
Battery Remaining Longevity: 108 mo
Battery Voltage: 2.78 V
Brady Statistic AP VP Percent: 89 %
Brady Statistic AS VP Percent: 11 %
Implantable Lead Location: 753859
Implantable Lead Model: 4470
Implantable Lead Serial Number: 525535
Implantable Pulse Generator Implant Date: 20140801
Lead Channel Impedance Value: 611 Ohm
Lead Channel Pacing Threshold Amplitude: 0.25 V
Lead Channel Pacing Threshold Amplitude: 0.625 V
Lead Channel Setting Pacing Amplitude: 1.5 V
Lead Channel Setting Pacing Amplitude: 2 V
Lead Channel Setting Pacing Pulse Width: 0.4 ms
MDC IDC LEAD IMPLANT DT: 20100511
MDC IDC LEAD IMPLANT DT: 20100511
MDC IDC LEAD LOCATION: 753860
MDC IDC LEAD SERIAL: 640302
MDC IDC MSMT LEADCHNL RA PACING THRESHOLD PULSEWIDTH: 0.4 ms
MDC IDC MSMT LEADCHNL RV IMPEDANCE VALUE: 616 Ohm
MDC IDC MSMT LEADCHNL RV PACING THRESHOLD PULSEWIDTH: 0.4 ms
MDC IDC SESS DTM: 20191023194402
MDC IDC SET LEADCHNL RV SENSING SENSITIVITY: 2 mV
MDC IDC STAT BRADY AP VS PERCENT: 0 %
MDC IDC STAT BRADY AS VS PERCENT: 0 %

## 2018-02-27 ENCOUNTER — Ambulatory Visit (INDEPENDENT_AMBULATORY_CARE_PROVIDER_SITE_OTHER): Payer: Medicare Other

## 2018-02-27 DIAGNOSIS — I495 Sick sinus syndrome: Secondary | ICD-10-CM

## 2018-02-28 LAB — CUP PACEART REMOTE DEVICE CHECK
Brady Statistic AP VP Percent: 88 %
Brady Statistic AP VS Percent: 0 %
Brady Statistic AS VP Percent: 12 %
Brady Statistic AS VS Percent: 0 %
Date Time Interrogation Session: 20200122153738
Implantable Lead Implant Date: 20100511
Implantable Lead Serial Number: 640302
Lead Channel Impedance Value: 593 Ohm
Lead Channel Impedance Value: 632 Ohm
Lead Channel Pacing Threshold Amplitude: 0.25 V
Lead Channel Pacing Threshold Amplitude: 0.5 V
Lead Channel Pacing Threshold Pulse Width: 0.4 ms
MDC IDC LEAD IMPLANT DT: 20100511
MDC IDC LEAD LOCATION: 753859
MDC IDC LEAD LOCATION: 753860
MDC IDC LEAD SERIAL: 525535
MDC IDC MSMT BATTERY IMPEDANCE: 348 Ohm
MDC IDC MSMT BATTERY REMAINING LONGEVITY: 105 mo
MDC IDC MSMT BATTERY VOLTAGE: 2.78 V
MDC IDC MSMT LEADCHNL RV PACING THRESHOLD PULSEWIDTH: 0.4 ms
MDC IDC PG IMPLANT DT: 20140801
MDC IDC SET LEADCHNL RA PACING AMPLITUDE: 1.5 V
MDC IDC SET LEADCHNL RV PACING AMPLITUDE: 2 V
MDC IDC SET LEADCHNL RV PACING PULSEWIDTH: 0.4 ms
MDC IDC SET LEADCHNL RV SENSING SENSITIVITY: 2 mV

## 2018-02-28 NOTE — Progress Notes (Signed)
Remote pacemaker transmission.   

## 2018-03-01 ENCOUNTER — Encounter: Payer: Self-pay | Admitting: Cardiology

## 2018-04-04 ENCOUNTER — Other Ambulatory Visit: Payer: Self-pay | Admitting: Cardiovascular Disease

## 2018-04-04 NOTE — Telephone Encounter (Signed)
Rx(s) sent to pharmacy electronically.  

## 2018-04-05 ENCOUNTER — Emergency Department (HOSPITAL_COMMUNITY): Payer: Medicare Other

## 2018-04-05 ENCOUNTER — Other Ambulatory Visit: Payer: Self-pay

## 2018-04-05 ENCOUNTER — Telehealth: Payer: Self-pay | Admitting: Cardiovascular Disease

## 2018-04-05 ENCOUNTER — Emergency Department (HOSPITAL_COMMUNITY)
Admission: EM | Admit: 2018-04-05 | Discharge: 2018-04-05 | Disposition: A | Discharge status: Home / Self Care | Payer: Medicare Other | Attending: Emergency Medicine | Admitting: Emergency Medicine

## 2018-04-05 DIAGNOSIS — S0990XA Unspecified injury of head, initial encounter: Secondary | ICD-10-CM | POA: Diagnosis not present

## 2018-04-05 DIAGNOSIS — S6991XA Unspecified injury of right wrist, hand and finger(s), initial encounter: Secondary | ICD-10-CM | POA: Diagnosis not present

## 2018-04-05 DIAGNOSIS — Z23 Encounter for immunization: Secondary | ICD-10-CM | POA: Insufficient documentation

## 2018-04-05 DIAGNOSIS — S60221A Contusion of right hand, initial encounter: Secondary | ICD-10-CM

## 2018-04-05 DIAGNOSIS — R42 Dizziness and giddiness: Secondary | ICD-10-CM | POA: Diagnosis not present

## 2018-04-05 DIAGNOSIS — Z79899 Other long term (current) drug therapy: Secondary | ICD-10-CM | POA: Insufficient documentation

## 2018-04-05 DIAGNOSIS — R55 Syncope and collapse: Secondary | ICD-10-CM | POA: Diagnosis not present

## 2018-04-05 DIAGNOSIS — Y929 Unspecified place or not applicable: Secondary | ICD-10-CM | POA: Diagnosis not present

## 2018-04-05 DIAGNOSIS — I10 Essential (primary) hypertension: Secondary | ICD-10-CM | POA: Diagnosis not present

## 2018-04-05 DIAGNOSIS — R0902 Hypoxemia: Secondary | ICD-10-CM | POA: Diagnosis not present

## 2018-04-05 DIAGNOSIS — W228XXA Striking against or struck by other objects, initial encounter: Secondary | ICD-10-CM | POA: Insufficient documentation

## 2018-04-05 DIAGNOSIS — Y939 Activity, unspecified: Secondary | ICD-10-CM | POA: Insufficient documentation

## 2018-04-05 DIAGNOSIS — Y999 Unspecified external cause status: Secondary | ICD-10-CM | POA: Diagnosis not present

## 2018-04-05 LAB — CBG MONITORING, ED: GLUCOSE-CAPILLARY: 86 mg/dL (ref 70–99)

## 2018-04-05 MED ORDER — TETANUS-DIPHTH-ACELL PERTUSSIS 5-2.5-18.5 LF-MCG/0.5 IM SUSP
0.5000 mL | Freq: Once | INTRAMUSCULAR | Status: AC
  Administered 2018-04-05: 0.5 mL via INTRAMUSCULAR
  Filled 2018-04-05: qty 0.5

## 2018-04-05 NOTE — Telephone Encounter (Signed)
Patient daughter states that her mother fell this morning and was taken to the hospital.  Daughter states that she thinks it is coming from [place], as her mother does not eat much but continues to take all of her medications, and her BP keeps dropping, as noted below.  She would like for Dr.C to be made aware, and give any recommendations.

## 2018-04-05 NOTE — ED Triage Notes (Signed)
Pt BIB [facility]. Pt hit her right hand on a cabinet this morning. Pt reports it made it sick to her stomach and light headed. Pt reports that she went to lay down and hit the left side of her head when she was lying down. Pt does take a blood thinner for a history of a-fib. Pt is alert and oriented x4 at present. Swelling above left eyebrow noted upon arrival to ED.

## 2018-04-05 NOTE — Discharge Instructions (Addendum)
If you develop new or severe headache, vomiting, weakness or numbness, blurry vision, or any other new/concerning symptoms then return to the ER for evaluation.

## 2018-04-05 NOTE — ED Notes (Signed)
Patient verbalizes understanding of discharge instructions. Opportunity for questioning and answers were provided. Pt discharged from ED. 

## 2018-04-05 NOTE — Telephone Encounter (Signed)
Called patient back, spoke with patient herself she was able to tell me everything that happened. She did hit her hand, and her head, but states it is not currently bleeding. Patient was oriented when we spoke. This was her first fall, and she states that she does feel better now, and her BP did come up (she did not mention what it was after I asked) patient was made aware if she began having any issues with her head/vision/memory/bleeding to be evaluated. They did complete a head CT in the hospital. Wanted to make you aware.  Thank you!

## 2018-04-05 NOTE — ED Provider Notes (Signed)
[facility] EMERGENCY DEPARTMENT Provider Note   CSN: [id number] Arrival date & time: 04/05/18  1022    History   Chief Complaint Chief Complaint  Patient presents with  . Near Syncope    HPI Toni Sanders is a 85 y.o. female.     HPI  85 year old female presents after a fall.  She woke up and was late for what she was going to do today and so she was rushing.  While in the bathroom she accidentally hit her hand on a cabinet.  She states that immediately she was having pain and then felt acutely nauseated and lightheaded.  She tried to sit down but think she went down a little too fast and hit the left side of her head on the rug.  Never lost consciousness.  Now is feeling much better and is not lightheaded and does not have headache or nausea/vomiting.  Her hand is sore but no significant pain.  She is on Xarelto for A. fib.  Never had show pain or shortness of breath.  Past Medical History:  Diagnosis Date  . Atrial flutter ([place]) 2010  . Bradycardia 2010  . Complete heart block ([place]) 2010   s/p PPM by Dr Doreatha Lew (MDT) 06/16/08  . H/O cardiovascular stress test 09/22/10   no ischemia, EF >60%  . LVH (left ventricular hypertrophy) 09/22/10   Echo >55% mild LVH   . Osteoporosis     Patient Active Problem List   Diagnosis Date Noted  . Long term current use of anticoagulant 07/16/2015  . Nonsustained ventricular tachycardia () 10/06/2012  . Pacemaker dependent secondary to complete heart block 09/13/2012  . Pacemaker battery depletion 09/06/2012  . Skin cancer- squamous cell, basal cell 09/06/2012  . Atrial fibrillation ([place]) 05/23/2010  . Complete heart block - dual-chamber pacemaker implanted 2010, generator change August 2014 Medtronic adapta 05/23/2010  . Hypertension 05/23/2010    Past Surgical History:  Procedure Laterality Date  . CHOLECYSTECTOMY  1994  . PACEMAKER GENERATOR CHANGE  09/06/12   new medtronic generator Adapta L secondary to  early battery depletion  . PACEMAKER GENERATOR CHANGE N/A 09/06/2012   Procedure: PACEMAKER GENERATOR CHANGE;  Surgeon: Sanda Klein, MD;  Location: [facility];  Service: Cardiovascular;  Laterality: N/A;  . PACEMAKER PLACEMENT  06/16/08   Implantation of dual-chamber Medtronic PPMY by Dr Doreatha Lew     OB History   No obstetric history on file.      Home Medications    Prior to Admission medications   Medication Sig Start Date End Date Taking? Authorizing Provider  fish oil-omega-3 fatty acids 1000 MG capsule Take 1 g by mouth daily. 1 tab po qd   Yes [provider]  metoprolol succinate (TOPROL-XL) 50 MG 24 hr tablet Take 1.5 tablets (75 mg total) by mouth daily. Patient taking differently: Take 75 mg by mouth daily.  04/04/18  Yes Croitoru, Mihai, MD  XARELTO 15 MG TABS tablet TAKE ONE TABLET DAILY WITH SUPPER Patient taking differently: Take 15 mg by mouth daily with supper.  04/23/17  Yes Croitoru, Mihai, MD  XARELTO 15 MG TABS tablet TAKE ONE TABLET DAILY WITH SUPPER Patient not taking: Reported on 04/05/2018 11/27/17   Croitoru, Dani Gobble, MD    Family History Family History  Problem Relation Age of Onset  . Cancer - Prostate Father   . Cancer - Ovarian Maternal Grandmother   . Pneumonia Paternal Grandmother   . Hypertension Unknown     Social  History Social History   Tobacco Use  . Smoking status: Never Smoker  . Smokeless tobacco: Never Used  Substance Use Topics  . Alcohol use: No  . Drug use: No     Allergies   Anesthetics, amide; Anesthetics, ester; Anesthetics, halogenated; and Morphine and related   Review of Systems Review of Systems  Respiratory: Negative for shortness of breath.   Cardiovascular: Negative for chest pain.  Gastrointestinal: Positive for nausea. Negative for vomiting.  Musculoskeletal: Positive for arthralgias.  Skin: Positive for wound.  Neurological: Positive for light-headedness. Negative for syncope and headaches.  All  other systems reviewed and are negative.    Physical Exam Updated Vital Signs BP (!) 154/81   Pulse 92   Resp 16   Ht 5' (1.524 m)   Wt 59 kg   SpO2 99%   BMI 25.39 kg/m   Physical Exam Vitals signs and nursing note reviewed.  Constitutional:      Appearance: She is well-developed.  HENT:     Head: Normocephalic. Contusion and laceration present.      Right Ear: External ear normal.     Left Ear: External ear normal.     Nose: Nose normal.  Eyes:     General:        Right eye: No discharge.        Left eye: No discharge.     Extraocular Movements: Extraocular movements intact.     Pupils: Pupils are equal, round, and reactive to light.  Cardiovascular:     Rate and Rhythm: Normal rate and regular rhythm.     Heart sounds: Normal heart sounds.  Pulmonary:     Effort: Pulmonary effort is normal.     Breath sounds: Normal breath sounds.  Abdominal:     Palpations: Abdomen is soft.     Tenderness: There is no abdominal tenderness.  Musculoskeletal:     Right wrist: She exhibits normal range of motion and no tenderness.     Right hand: She exhibits tenderness.       Hands:     Comments: Normal radial, ulnar, median nerve testing in right hand. Normal strength/sensation  Skin:    General: Skin is warm and dry.  Neurological:     Mental Status: She is alert.     Comments: CN 3-12 grossly intact. 5/5 strength in all 4 extremities. Grossly normal sensation. Normal finger to nose.   Psychiatric:        Mood and Affect: Mood is not anxious.      ED Treatments / Results  Labs (all labs ordered are listed, but only abnormal results are displayed) Labs Reviewed  CBG MONITORING, ED    EKG EKG Interpretation  Date/Time:  Friday April 05 2018 10:33:53 EST Ventricular Rate:  72 PR Interval:    QRS Duration: 145 QT Interval:  423 QTC Calculation: 463 R Axis:   -69 Text Interpretation:  Sinus rhythm Short PR interval Left bundle branch block No significant  change since 2014 Confirmed by Sherwood Gambler [phone]) on 04/05/2018 10:59:04 AM   Radiology Ct Head Wo Contrast  Result Date: 04/05/2018 CLINICAL DATA:  Injured left side of head. Patient is on anticoagulation. EXAM: CT HEAD WITHOUT CONTRAST TECHNIQUE: Contiguous axial images were obtained from the base of the skull through the vertex without intravenous contrast. COMPARISON:  None. FINDINGS: Brain: Moderate, likely age-appropriate, atrophy with sulcal prominence and prominence of the bifrontal extra-axial spaces. Scattered periventricular hypodensities compatible with microvascular ischemic disease. Bilateral basal  ganglial calcifications. Given background parenchymal abnormalities, there is no CT evidence of superimposed acute large territory infarct. No intraparenchymal or extra-axial mass or hemorrhage. Normal size and configuration of the ventricles and the basilar cisterns. No midline shift. Vascular: Intracranial atherosclerosis. Skull: No displaced calvarial fracture with special attention paid to the left orbit and frontal calvarium. Sinuses/Orbits: Limited visualization the paranasal sinuses and mastoid air cells is normal. Post right-sided cataract surgery. Normal noncontrast appearance of the left orbit. Other: Focal soft tissue swelling about the superolateral aspect of the left orbit (image 13, series 3). No radiopaque foreign body. IMPRESSION: 1. Focal soft tissue swelling about the superolateral aspect of the left orbit without associated fracture, radiopaque foreign body or acute intracranial process. 2. Moderate, likely age-appropriate, atrophy and microvascular ischemic disease. Electronically Signed   By: Sandi Mariscal M.D.   On: 04/05/2018 11:44   Dg Hand Complete Right  Result Date: 04/05/2018 CLINICAL DATA:  Right hand injury today. EXAM: RIGHT HAND - COMPLETE 3+ VIEW COMPARISON:  None. FINDINGS: There is no evidence of fracture or dislocation. Severe degenerative changes seen  involving the first carpometacarpal joint. Severe degenerative changes are also seen involving the second and third distal interphalangeal joints. Degenerative changes are also seen involving the first, second and third metacarpophalangeal joints. Soft tissues are unremarkable. 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 No acute abnormality seen in the right hand. Electronically Signed   By: Marijo Conception, M.D.   On: 04/05/2018 11:41    Procedures Procedures (including critical care time)  Medications Ordered in ED Medications  Tdap (BOOSTRIX) injection 0.5 mL (0.5 mLs Intramuscular Given 04/05/18 1342)     Initial Impression / Assessment and Plan / ED Course  I have reviewed the triage vital signs and the nursing notes.  Pertinent labs & imaging results that were available during my care of the patient were reviewed by me and considered in my medical decision making (see chart for details).        Patient's fall/lightheadedness/near syncope appears to be vasovagal after painful stimulus.  Her pacemaker was interrogated and had no acute events.  CBG benign.  Vitals are stable.  She does have hematoma and abrasion lateral to her orbit, but otherwise her CT is benign and her hand x-ray is benign.  She appears well.  She appears stable for discharge home with wound care.  Tetanus was updated.  Given no other complaints at all think lab work is needed.  Final Clinical Impressions(s) / ED Diagnoses   Final diagnoses:  Vasovagal near-syncope  Contusion of right hand, initial encounter  Injury of head, initial encounter    ED Discharge Orders    None       Sherwood Gambler, MD 04/05/18 1615

## 2018-04-05 NOTE — Telephone Encounter (Signed)
Xarelto will not cause low BP, unless she has been bleeding. I am sure her blood counts will be checked at the hospital. On the other hand, if she has frequent falls, the risk of bleeding with Xarelto (especially intracranial bleeding) becomes a big concern and we may have to reassess the benefit in stroke prevention relative to the risk.

## 2018-04-05 NOTE — Telephone Encounter (Signed)
Pt c/o medication issue:  1. Name of Medication: XARELTO 15 MG TABS tablet  2. How are you currently taking this medication (dosage and times per day)? TAKE ONE TABLET DAILY WITH SUPPER Patient taking differently: Take 15 mg by mouth daily with supper.   3. Are you having a reaction (difficulty breathing--STAT)?   4. What is your medication issue? Patient's daughter's Bonnita Nasuti called to advised Dr. Sallyanne Kuster that her mother fell today.  She was taken to the hospital.  She is wondering if the medication has anything to do with this as when she got to the hospital was low.  It was 96/52 when the ambulance arrived.

## 2018-04-11 DIAGNOSIS — I10 Essential (primary) hypertension: Secondary | ICD-10-CM | POA: Diagnosis not present

## 2018-04-11 DIAGNOSIS — S0093XD Contusion of unspecified part of head, subsequent encounter: Secondary | ICD-10-CM | POA: Diagnosis not present

## 2018-04-11 DIAGNOSIS — I480 Paroxysmal atrial fibrillation: Secondary | ICD-10-CM | POA: Diagnosis not present

## 2018-04-11 DIAGNOSIS — R55 Syncope and collapse: Secondary | ICD-10-CM | POA: Diagnosis not present

## 2018-05-08 ENCOUNTER — Other Ambulatory Visit: Payer: Self-pay | Admitting: Cardiovascular Disease

## 2018-05-10 NOTE — Telephone Encounter (Signed)
Scr= 0.9 per [facility] ([place]) 05-07-2017  CrCl = 32ml/min

## 2018-05-29 ENCOUNTER — Ambulatory Visit (INDEPENDENT_AMBULATORY_CARE_PROVIDER_SITE_OTHER): Payer: Medicare Other | Admitting: *Deleted

## 2018-05-29 ENCOUNTER — Other Ambulatory Visit: Payer: Self-pay

## 2018-05-29 DIAGNOSIS — I495 Sick sinus syndrome: Secondary | ICD-10-CM

## 2018-05-30 LAB — CUP PACEART REMOTE DEVICE CHECK
Battery Impedance: 373 Ohm
Battery Remaining Longevity: 103 mo
Battery Voltage: 2.78 V
Brady Statistic AP VP Percent: 91 %
Brady Statistic AP VS Percent: 0 %
Brady Statistic AS VP Percent: 9 %
Brady Statistic AS VS Percent: 0 %
Date Time Interrogation Session: 20200422213145
Implantable Lead Implant Date: 20100511
Implantable Lead Implant Date: 20100511
Implantable Lead Location: 753859
Implantable Lead Location: 753860
Implantable Lead Model: 4469
Implantable Lead Model: 4470
Implantable Lead Serial Number: 525535
Implantable Lead Serial Number: 640302
Implantable Pulse Generator Implant Date: 20140801
Lead Channel Impedance Value: 601 Ohm
Lead Channel Impedance Value: 630 Ohm
Lead Channel Pacing Threshold Amplitude: 0.25 V
Lead Channel Pacing Threshold Amplitude: 0.625 V
Lead Channel Pacing Threshold Pulse Width: 0.4 ms
Lead Channel Pacing Threshold Pulse Width: 0.4 ms
Lead Channel Setting Pacing Amplitude: 1.5 V
Lead Channel Setting Pacing Amplitude: 2 V
Lead Channel Setting Pacing Pulse Width: 0.4 ms
Lead Channel Setting Sensing Sensitivity: 2 mV

## 2018-06-06 ENCOUNTER — Encounter: Payer: Self-pay | Admitting: Cardiology

## 2018-06-06 NOTE — Progress Notes (Signed)
Remote pacemaker transmission.   

## 2018-08-21 ENCOUNTER — Other Ambulatory Visit: Payer: Self-pay | Admitting: Cardiovascular Disease

## 2018-08-28 ENCOUNTER — Ambulatory Visit (INDEPENDENT_AMBULATORY_CARE_PROVIDER_SITE_OTHER): Payer: Medicare Other | Admitting: *Deleted

## 2018-08-28 DIAGNOSIS — I480 Paroxysmal atrial fibrillation: Secondary | ICD-10-CM | POA: Diagnosis not present

## 2018-08-28 DIAGNOSIS — I472 Ventricular tachycardia: Secondary | ICD-10-CM

## 2018-08-28 DIAGNOSIS — I4729 Other ventricular tachycardia: Secondary | ICD-10-CM

## 2018-08-29 LAB — CUP PACEART REMOTE DEVICE CHECK
Battery Impedance: 422 Ohm
Battery Remaining Longevity: 99 mo
Battery Voltage: 2.77 V
Brady Statistic AP VP Percent: 90 %
Brady Statistic AP VS Percent: 0 %
Brady Statistic AS VP Percent: 10 %
Brady Statistic AS VS Percent: 0 %
Date Time Interrogation Session: 20200722195048
Implantable Lead Implant Date: 20100511
Implantable Lead Implant Date: 20100511
Implantable Lead Location: 753859
Implantable Lead Location: 753860
Implantable Lead Model: 4469
Implantable Lead Model: 4470
Implantable Lead Serial Number: 525535
Implantable Lead Serial Number: 640302
Implantable Pulse Generator Implant Date: 20140801
Lead Channel Impedance Value: 623 Ohm
Lead Channel Impedance Value: 662 Ohm
Lead Channel Pacing Threshold Amplitude: 0.25 V
Lead Channel Pacing Threshold Amplitude: 0.625 V
Lead Channel Pacing Threshold Pulse Width: 0.4 ms
Lead Channel Pacing Threshold Pulse Width: 0.4 ms
Lead Channel Setting Pacing Amplitude: 1.5 V
Lead Channel Setting Pacing Amplitude: 2 V
Lead Channel Setting Pacing Pulse Width: 0.4 ms
Lead Channel Setting Sensing Sensitivity: 2 mV

## 2018-09-03 DIAGNOSIS — I129 Hypertensive chronic kidney disease with stage 1 through stage 4 chronic kidney disease, or unspecified chronic kidney disease: Secondary | ICD-10-CM | POA: Diagnosis not present

## 2018-09-03 DIAGNOSIS — E7849 Other hyperlipidemia: Secondary | ICD-10-CM | POA: Diagnosis not present

## 2018-09-11 NOTE — Progress Notes (Signed)
Remote pacemaker transmission.   

## 2018-10-07 ENCOUNTER — Ambulatory Visit (INDEPENDENT_AMBULATORY_CARE_PROVIDER_SITE_OTHER): Payer: Medicare Other | Admitting: Cardiovascular Disease

## 2018-10-07 ENCOUNTER — Encounter: Payer: Self-pay | Admitting: Cardiovascular Disease

## 2018-10-07 ENCOUNTER — Other Ambulatory Visit: Payer: Self-pay

## 2018-10-07 VITALS — BP 122/74 | HR 84 | Temp 97.2°F | Ht 60.0 in | Wt 132.4 lb

## 2018-10-07 DIAGNOSIS — I4729 Other ventricular tachycardia: Secondary | ICD-10-CM

## 2018-10-07 DIAGNOSIS — Z950 Presence of cardiac pacemaker: Secondary | ICD-10-CM | POA: Diagnosis not present

## 2018-10-07 DIAGNOSIS — I480 Paroxysmal atrial fibrillation: Secondary | ICD-10-CM | POA: Diagnosis not present

## 2018-10-07 DIAGNOSIS — R55 Syncope and collapse: Secondary | ICD-10-CM

## 2018-10-07 DIAGNOSIS — I472 Ventricular tachycardia: Secondary | ICD-10-CM | POA: Diagnosis not present

## 2018-10-07 DIAGNOSIS — I442 Atrioventricular block, complete: Secondary | ICD-10-CM | POA: Diagnosis not present

## 2018-10-07 DIAGNOSIS — I10 Essential (primary) hypertension: Secondary | ICD-10-CM

## 2018-10-07 DIAGNOSIS — Z7901 Long term (current) use of anticoagulants: Secondary | ICD-10-CM

## 2018-10-07 NOTE — Progress Notes (Signed)
Cardiology Office Note    Date:  10/08/2018   ID:  Toni Sanders, DOB 08-29-1922, MRN [id number]  PCP:  Burnard Bunting, MD  Cardiologist:   Sanda Klein, MD   Chief Complaint  Patient presents with  . Pacemaker Check  . Atrial Fibrillation    History of Present Illness:  Toni Sanders is a 85 y.o. female with complete heart block, dual-chamber permanent pacemaker, systemic hypertension, history of paroxysmal atrial flutter and atrial fibrillation and history of rare nonsustained VT, returning for follow-up.  She continues to have a difficult year socially, but has not had serious health problems.  It is been less than 2 years since her husband passed away.  Her daughter has progressive metastatic breast cancer and is responding poorly to treatment.  They live in the same household.  She does have support from her other 2 daughters.  They take care of the grocery shopping and insist that their mother should not leave her house due to the coronavirus pandemic.  She has been less physically active.  She had a syncopal event in February that sounds strongly suggestive of a vasovagal event.  She struck her hand against the bathroom counter and it was very painful.  She felt the typical prodromal symptoms of a vagal event and recognized them as such, but tried to get to her bedroom chair and fainted on the way.  She recovered immediately but did have a gash to her head.  She had a CT scan in the emergency room that did not show evidence of intracranial hemorrhage.  She has not had any other falls, serious injuries or bleeding problems, although she does bruise easily.  She denies other cardiovascular complaints.The patient specifically denies any chest pain at rest exertion, dyspnea at rest or with exertion, orthopnea, paroxysmal nocturnal dyspnea, palpitations, focal neurological deficits, intermittent claudication, lower extremity edema, unexplained weight gain, cough, hemoptysis or  wheezing.  She is pacemaker dependent due to complete heart block.  Interrogation of her device shows normal function. Her Medtronic Adapta pacemaker was implanted in 2014 and still has roughly 8 years of longevity.  Lead parameters remain excellent.  She has 90% atrial pacing and 100% ventricular pacing. The burden of atrial fibrillation is 2%, which is slightly higher than her historical trend.  Most of the episodes occurred in the month of March she has not had any atrial fibrillation since May 9.  As on previous downloads she has occasional brief episodes of nonsustained ventricular tachycardia, most recently 4-second events that occurred on April 17, after bedtime.   Past Medical History:  Diagnosis Date  . Atrial flutter ([place]) 2010  . Bradycardia 2010  . Complete heart block ([place]) 2010   s/p PPM by Dr Doreatha Lew (MDT) 06/16/08  . H/O cardiovascular stress test 09/22/10   no ischemia, EF >60%  . LVH (left ventricular hypertrophy) 09/22/10   Echo >55% mild LVH   . Osteoporosis     Past Surgical History:  Procedure Laterality Date  . CHOLECYSTECTOMY  1994  . PACEMAKER GENERATOR CHANGE  09/06/12   new medtronic generator Adapta L secondary to early battery depletion  . PACEMAKER GENERATOR CHANGE N/A 09/06/2012   Procedure: PACEMAKER GENERATOR CHANGE;  Surgeon: Sanda Klein, MD;  Location: [facility];  Service: Cardiovascular;  Laterality: N/A;  . PACEMAKER PLACEMENT  06/16/08   Implantation of dual-chamber Medtronic PPMY by Dr Doreatha Lew    Current Medications: Outpatient Medications Prior to Visit  Medication Sig Dispense Refill  .  fish oil-omega-3 fatty acids 1000 MG capsule Take 1 g by mouth daily. 1 tab po qd    . metoprolol succinate (TOPROL-XL) 50 MG 24 hr tablet Take 1.5 tablets (75 mg total) by mouth daily. (Patient taking differently: Take 75 mg by mouth daily. ) 45 tablet 6  . XARELTO 15 MG TABS tablet TAKE ONE TABLET DAILY WITH SUPPER 90 tablet 0   No facility-administered  medications prior to visit.      Allergies:   Anesthetics, amide; Anesthetics, ester; Anesthetics, halogenated; and Morphine and related   Social History   Socioeconomic History  . Marital status: Married    Spouse name: Not on file  . Number of children: Not on file  . Years of education: Not on file  . Highest education level: Not on file  Occupational History  . Not on file  Social Needs  . Financial resource strain: Not on file  . Food insecurity    Worry: Not on file    Inability: Not on file  . Transportation needs    Medical: Not on file    Non-medical: Not on file  Tobacco Use  . Smoking status: Never Smoker  . Smokeless tobacco: Never Used  Substance and Sexual Activity  . Alcohol use: No  . Drug use: No  . Sexual activity: Not on file  Lifestyle  . Physical activity    Days per week: Not on file    Minutes per session: Not on file  . Stress: Not on file  Relationships  . Social Herbalist on phone: Not on file    Gets together: Not on file    Attends religious service: Not on file    Active member of club or organization: Not on file    Attends meetings of clubs or organizations: Not on file    Relationship status: Not on file  Other Topics Concern  . Not on file  Social History Narrative   Lives with spouse in [place].     Family History:  The patient's family history includes Cancer - Ovarian in her maternal grandmother; Cancer - Prostate in her father; Hypertension in her unknown relative; Pneumonia in her paternal grandmother.   ROS:   Please see the history of present illness.    ROS all other systems are reviewed and are negative   PHYSICAL EXAM:   VS:  BP 122/74   Pulse 84   Temp (!) 97.2 F (36.2 C)   Ht 5' (1.524 m)   Wt 132 lb 6.4 oz (60.1 kg)   BMI 25.86 kg/m      General: Alert, oriented x3, no distress, healthy left subclavian pacemaker site Head: no evidence of trauma, PERRL, EOMI, no exophtalmos or lid lag, no  myxedema, no xanthelasma; normal ears, nose and oropharynx Neck: normal jugular venous pulsations and no hepatojugular reflux; brisk carotid pulses without delay and no carotid bruits Chest: clear to auscultation, no signs of consolidation by percussion or palpation, normal fremitus, symmetrical and full respiratory excursions Cardiovascular: normal position and quality of the apical impulse, regular rhythm, normal first and paradoxically split second heart sounds, no murmurs, rubs or gallops Abdomen: no tenderness or distention, no masses by palpation, no abnormal pulsatility or arterial bruits, normal bowel sounds, no hepatosplenomegaly Extremities: no clubbing, cyanosis or edema; 2+ radial, ulnar and brachial pulses bilaterally; 2+ right femoral, posterior tibial and dorsalis pedis pulses; 2+ left femoral, posterior tibial and dorsalis pedis pulses; no subclavian or femoral  bruits Neurological: grossly nonfocal Psych: Normal mood and affect    Wt Readings from Last 3 Encounters:  10/07/18 132 lb 6.4 oz (60.1 kg)  04/05/18 130 lb (59 kg)  10/05/17 138 lb (62.6 kg)      Studies/Labs Reviewed:   EKG:  EKG is not ordered today.  The ekg ordered 04/05/2018 shows atrial paced, ventricular paced rhythm  ASSESSMENT:    1. Complete heart block - dual-chamber pacemaker implanted 2010, generator change August 2014 Medtronic adapta   2. Pacemaker dependent secondary to complete heart block   3. Paroxysmal atrial fibrillation (HCC)   4. Nonsustained ventricular tachycardia ([place])   5. Essential hypertension   6. Vasovagal syncope   7. Long term current use of anticoagulant      PLAN:  In order of problems listed above:  1. CHB: She is pacemaker dependent. 2. PPM: Normal pacemaker function, continue remote downloads every 3 months and yearly office visit. 3. AFib: Slight increase in the burden of atrial fibrillation in the month of March, remains asymptomatic. Appropriately anticoagulated  CHADSVasc 4 (age 23, gender, HTN). 4. NSVT: Asymptomatic.  Relatively lengthy episode recorded by her pacemaker in 2016, a brief episode in January 2019 and in April 2020. Continue beta blocker. 5. HTN: Well-controlled. 6. Vasovagal syncope: This is an infrequent but lifelong complaints.  She can recognize the prodrome.  Advised her to lay down immediately when this occurs to avoid falls and injuries. 7. Xarelto: She has had 1 fall.  Discussed the fact that frequent falls might lead [place] to reconsider the indication for anticoagulation, but for the time being I think the benefit is still in favor of continuing this medication.    Medication Adjustments/Labs and Tests Ordered: Current medicines are reviewed at length with the patient today.  Concerns regarding medicines are outlined above.  Medication changes, Labs and Tests ordered today are listed in the Patient Instructions below. Patient Instructions  Medication Instructions:  Your physician recommends that you continue on your current medications as directed. Please refer to the Current Medication list given to you today.  If you need a refill on your cardiac medications before your next appointment, please call your pharmacy.   Lab work: None ordered If you have labs (blood work) drawn today and your tests are completely normal, you will receive your results only by: [place] (if you have MyChart) OR A paper copy in the mail If you have any lab test that is abnormal or we need to change your treatment, we will call you to review the results.  Testing/Procedures: None ordered  Follow-Up: At [facility], you and your health needs are our priority.  As part of our continuing mission to provide you with exceptional heart care, we have created designated Provider Care Teams.  These Care Teams include your primary Cardiologist (physician) and Advanced Practice Providers (APPs -  Physician Assistants and Nurse Practitioners) who all  work together to provide you with the care you need, when you need it. You will need a follow up appointment in 12 months.  Please call our office 2 months in advance to schedule this appointment.  You may see Sanda Klein, MD or one of the following Advanced Practice Providers on your designated Care Team: Almyra Deforest, PA-C Fabian Sharp, [place]          Signed, Sanda Klein, MD  10/08/2018 8:52 AM    [address], [place], [place]  [address] Phone: [phone])  [phone]; Fax: [phone]    

## 2018-10-07 NOTE — Patient Instructions (Signed)

## 2018-10-08 ENCOUNTER — Encounter: Payer: Self-pay | Admitting: Cardiovascular Disease

## 2018-10-08 DIAGNOSIS — R55 Syncope and collapse: Secondary | ICD-10-CM | POA: Insufficient documentation

## 2018-10-08 NOTE — Progress Notes (Signed)
Patient ID: Toni Sanders, female   DOB: 1922/11/11, 85 y.o.   MRN: [id number]

## 2018-11-07 ENCOUNTER — Other Ambulatory Visit: Payer: Self-pay | Admitting: Cardiovascular Disease

## 2018-11-25 ENCOUNTER — Other Ambulatory Visit: Payer: Self-pay | Admitting: Cardiovascular Disease

## 2018-11-25 NOTE — Telephone Encounter (Signed)
KPN  SCR=0.8

## 2018-11-28 ENCOUNTER — Ambulatory Visit (INDEPENDENT_AMBULATORY_CARE_PROVIDER_SITE_OTHER): Payer: Medicare Other | Admitting: *Deleted

## 2018-11-28 DIAGNOSIS — I480 Paroxysmal atrial fibrillation: Secondary | ICD-10-CM | POA: Diagnosis not present

## 2018-11-28 DIAGNOSIS — I4729 Other ventricular tachycardia: Secondary | ICD-10-CM

## 2018-11-28 DIAGNOSIS — I472 Ventricular tachycardia: Secondary | ICD-10-CM

## 2018-11-28 LAB — CUP PACEART REMOTE DEVICE CHECK
Battery Impedance: 471 Ohm
Battery Remaining Longevity: 92 mo
Battery Voltage: 2.78 V
Brady Statistic AP VP Percent: 91 %
Brady Statistic AP VS Percent: 0 %
Brady Statistic AS VP Percent: 9 %
Brady Statistic AS VS Percent: 0 %
Date Time Interrogation Session: 20201022163040
Implantable Lead Implant Date: 20100511
Implantable Lead Implant Date: 20100511
Implantable Lead Location: 753859
Implantable Lead Location: 753860
Implantable Lead Model: 4469
Implantable Lead Model: 4470
Implantable Lead Serial Number: 525535
Implantable Lead Serial Number: 640302
Implantable Pulse Generator Implant Date: 20140801
Lead Channel Impedance Value: 569 Ohm
Lead Channel Impedance Value: 573 Ohm
Lead Channel Pacing Threshold Amplitude: 0.25 V
Lead Channel Pacing Threshold Amplitude: 0.625 V
Lead Channel Pacing Threshold Pulse Width: 0.4 ms
Lead Channel Pacing Threshold Pulse Width: 0.4 ms
Lead Channel Setting Pacing Amplitude: 1.5 V
Lead Channel Setting Pacing Amplitude: 2 V
Lead Channel Setting Pacing Pulse Width: 0.4 ms
Lead Channel Setting Sensing Sensitivity: 2 mV

## 2018-12-10 NOTE — Progress Notes (Signed)
Remote pacemaker transmission.   

## 2018-12-20 DIAGNOSIS — Z23 Encounter for immunization: Secondary | ICD-10-CM | POA: Diagnosis not present

## 2019-02-27 ENCOUNTER — Ambulatory Visit (INDEPENDENT_AMBULATORY_CARE_PROVIDER_SITE_OTHER): Payer: Medicare Other | Admitting: *Deleted

## 2019-02-27 DIAGNOSIS — I480 Paroxysmal atrial fibrillation: Secondary | ICD-10-CM

## 2019-02-27 LAB — CUP PACEART REMOTE DEVICE CHECK
Battery Impedance: 520 Ohm
Battery Remaining Longevity: 91 mo
Battery Voltage: 2.78 V
Brady Statistic AP VP Percent: 91 %
Brady Statistic AP VS Percent: 0 %
Brady Statistic AS VP Percent: 9 %
Brady Statistic AS VS Percent: 0 %
Date Time Interrogation Session: 20210121142633
Implantable Lead Implant Date: 20100511
Implantable Lead Implant Date: 20100511
Implantable Lead Location: 753859
Implantable Lead Location: 753860
Implantable Lead Model: 4469
Implantable Lead Model: 4470
Implantable Lead Serial Number: 525535
Implantable Lead Serial Number: 640302
Implantable Pulse Generator Implant Date: 20140801
Lead Channel Impedance Value: 621 Ohm
Lead Channel Impedance Value: 655 Ohm
Lead Channel Pacing Threshold Amplitude: 0.375 V
Lead Channel Pacing Threshold Amplitude: 0.625 V
Lead Channel Pacing Threshold Pulse Width: 0.4 ms
Lead Channel Pacing Threshold Pulse Width: 0.4 ms
Lead Channel Setting Pacing Amplitude: 1.5 V
Lead Channel Setting Pacing Amplitude: 2 V
Lead Channel Setting Pacing Pulse Width: 0.4 ms
Lead Channel Setting Sensing Sensitivity: 2 mV

## 2019-03-05 DIAGNOSIS — I129 Hypertensive chronic kidney disease with stage 1 through stage 4 chronic kidney disease, or unspecified chronic kidney disease: Secondary | ICD-10-CM | POA: Diagnosis not present

## 2019-03-05 DIAGNOSIS — E785 Hyperlipidemia, unspecified: Secondary | ICD-10-CM | POA: Diagnosis not present

## 2019-03-05 DIAGNOSIS — I480 Paroxysmal atrial fibrillation: Secondary | ICD-10-CM | POA: Diagnosis not present

## 2019-03-05 DIAGNOSIS — Z950 Presence of cardiac pacemaker: Secondary | ICD-10-CM | POA: Diagnosis not present

## 2019-05-29 ENCOUNTER — Ambulatory Visit (INDEPENDENT_AMBULATORY_CARE_PROVIDER_SITE_OTHER): Payer: Medicare Other | Admitting: *Deleted

## 2019-05-29 DIAGNOSIS — I480 Paroxysmal atrial fibrillation: Secondary | ICD-10-CM

## 2019-05-30 LAB — CUP PACEART REMOTE DEVICE CHECK
Battery Impedance: 569 Ohm
Battery Remaining Longevity: 86 mo
Battery Voltage: 2.77 V
Brady Statistic AP VP Percent: 91 %
Brady Statistic AP VS Percent: 0 %
Brady Statistic AS VP Percent: 9 %
Brady Statistic AS VS Percent: 0 %
Date Time Interrogation Session: 20210423122513
Implantable Lead Implant Date: 20100511
Implantable Lead Implant Date: 20100511
Implantable Lead Location: 753859
Implantable Lead Location: 753860
Implantable Lead Model: 4469
Implantable Lead Model: 4470
Implantable Lead Serial Number: 525535
Implantable Lead Serial Number: 640302
Implantable Pulse Generator Implant Date: 20140801
Lead Channel Impedance Value: 603 Ohm
Lead Channel Impedance Value: 613 Ohm
Lead Channel Pacing Threshold Amplitude: 0.375 V
Lead Channel Pacing Threshold Amplitude: 0.5 V
Lead Channel Pacing Threshold Pulse Width: 0.4 ms
Lead Channel Pacing Threshold Pulse Width: 0.4 ms
Lead Channel Setting Pacing Amplitude: 1.5 V
Lead Channel Setting Pacing Amplitude: 2 V
Lead Channel Setting Pacing Pulse Width: 0.4 ms
Lead Channel Setting Sensing Sensitivity: 2 mV

## 2019-05-30 NOTE — Progress Notes (Signed)
PPM Remote  

## 2019-06-02 ENCOUNTER — Other Ambulatory Visit: Payer: Self-pay | Admitting: Cardiovascular Disease

## 2019-06-02 NOTE — Telephone Encounter (Signed)
89 F 60.1 kg SCr 0.8 (08/2018), CrCl 40.8, LOV Croitoru 09/2018

## 2019-06-04 DIAGNOSIS — N182 Chronic kidney disease, stage 2 (mild): Secondary | ICD-10-CM | POA: Diagnosis not present

## 2019-06-04 DIAGNOSIS — J302 Other seasonal allergic rhinitis: Secondary | ICD-10-CM | POA: Diagnosis not present

## 2019-06-04 DIAGNOSIS — J069 Acute upper respiratory infection, unspecified: Secondary | ICD-10-CM | POA: Diagnosis not present

## 2019-06-04 DIAGNOSIS — I129 Hypertensive chronic kidney disease with stage 1 through stage 4 chronic kidney disease, or unspecified chronic kidney disease: Secondary | ICD-10-CM | POA: Diagnosis not present

## 2019-06-17 ENCOUNTER — Other Ambulatory Visit: Payer: Self-pay | Admitting: Cardiovascular Disease

## 2019-08-24 IMAGING — CT CT HEAD W/O CM
4 series · 16 of 47 positions shown, 18 images · non-contrast
Comparison: None.

CLINICAL DATA: Injured left side of head. Patient is on
anticoagulation.

EXAM:
CT HEAD WITHOUT CONTRAST
TECHNIQUE: Contiguous axial images were obtained from the base of the skull
through the vertex without intravenous contrast.

[Series 3: head wo · axial · 0.41mm/px · z∈[-176,-56]mm · 7 of 33 slices shown, 9 images]
[im 5/33  brain]
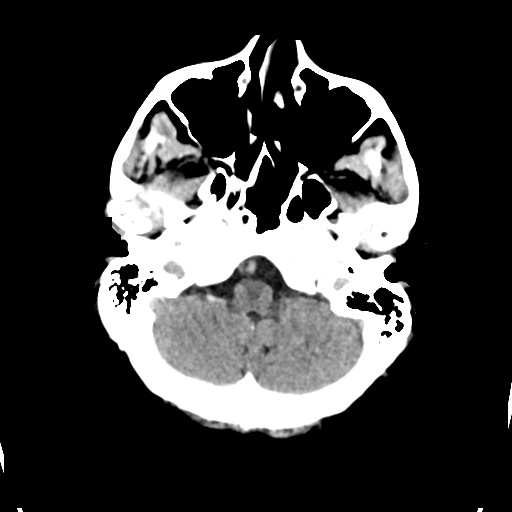
[im 5/33  bone]
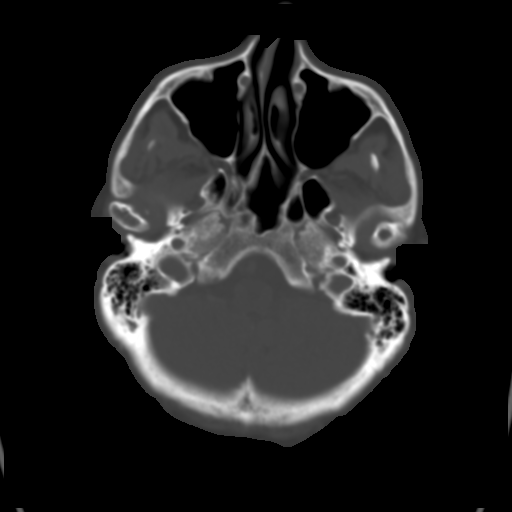
[im 9/33  brain]
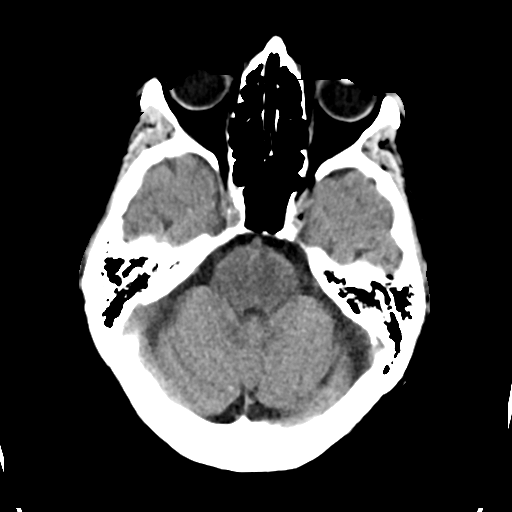
[im 13/33  brain]
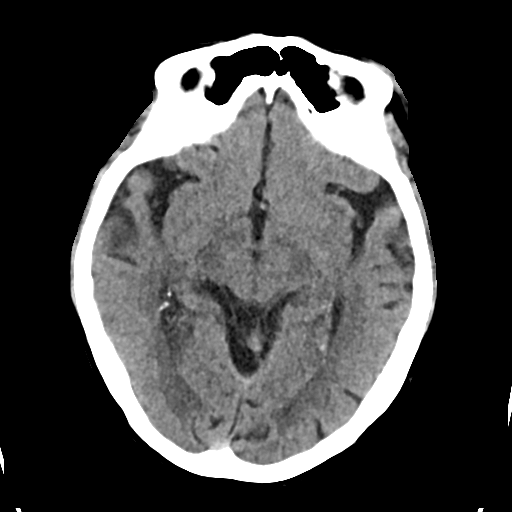
[im 17/33  brain]
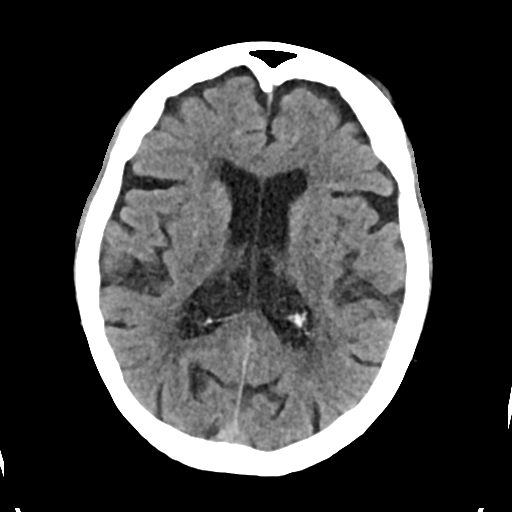
[im 21/33  brain]
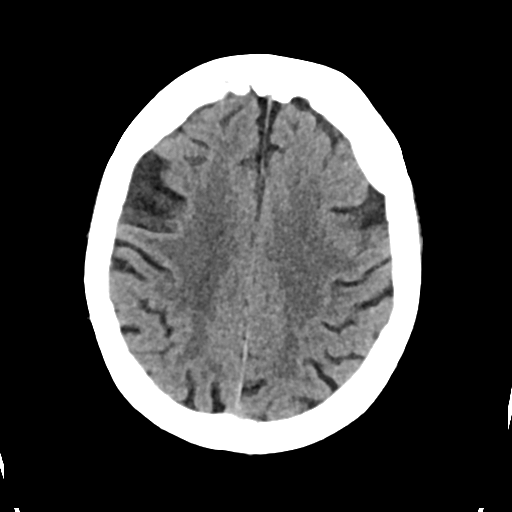
[im 21/33  bone]
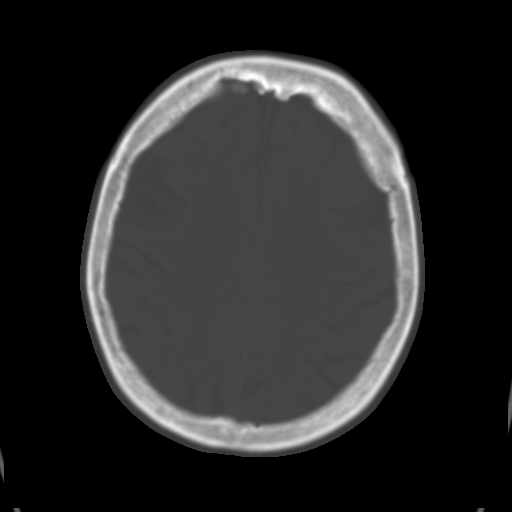
[im 25/33  brain]
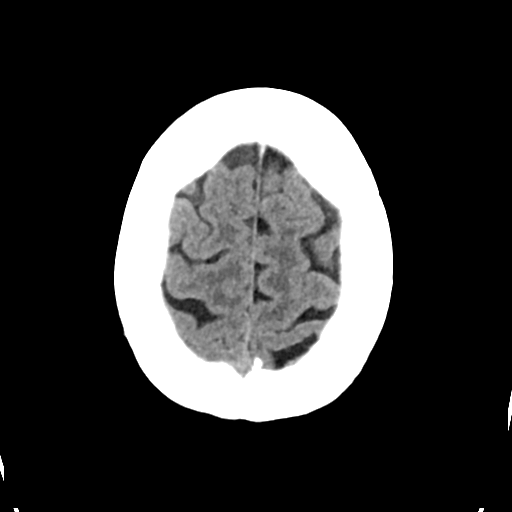
[im 29/33  brain]
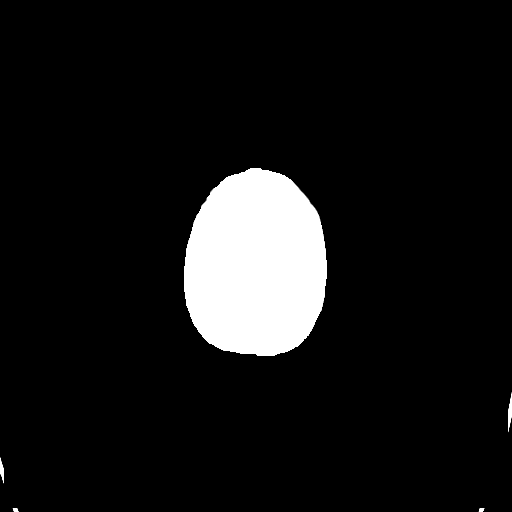

[Series 4: head bone · axial · 0.41mm/px · z∈[-180,-148]mm · 3 of 81 slices shown]
[im 9/81  bone]
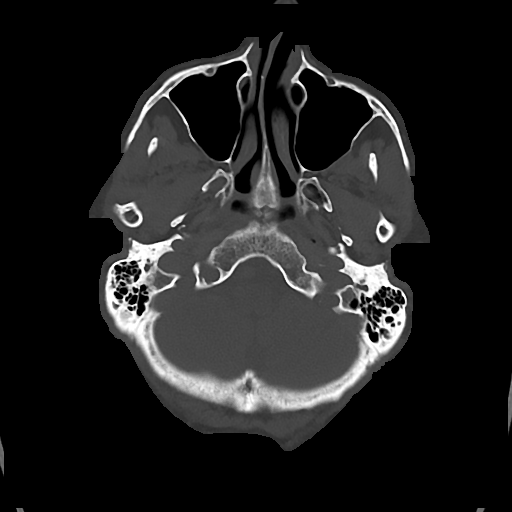
[im 17/81  bone]
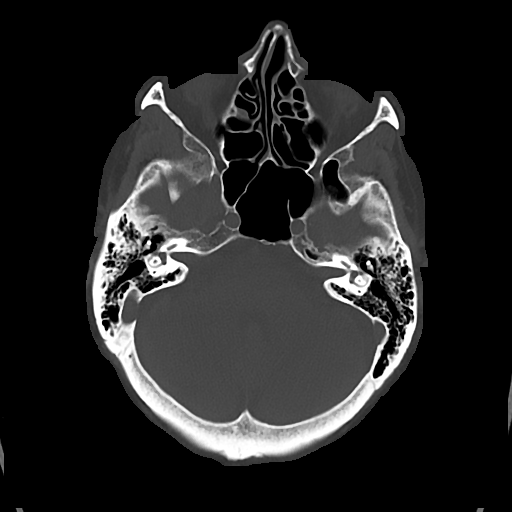
[im 25/81  bone]
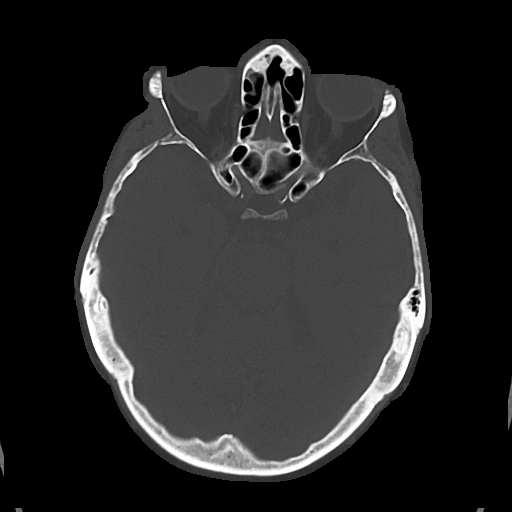

[Series 5: cor soft · coronal · 0.31mm/px · 3 of 68 slices shown]
[im 23/68  brain]
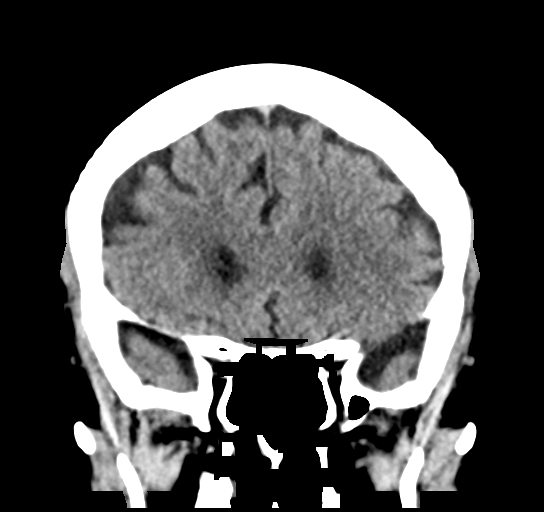
[im 30/68  brain]
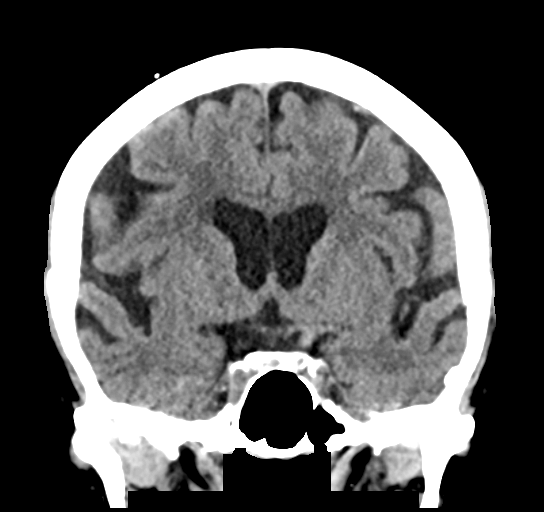
[im 38/68  brain]
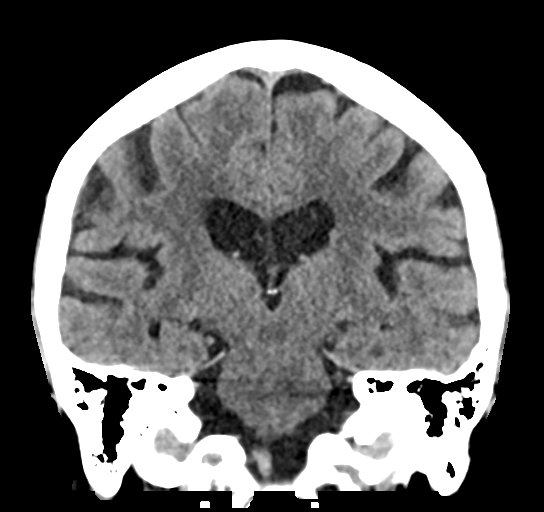

[Series 6: sag soft · sagittal · 0.31mm/px · 3 of 53 slices shown]
[im 18/53  brain]
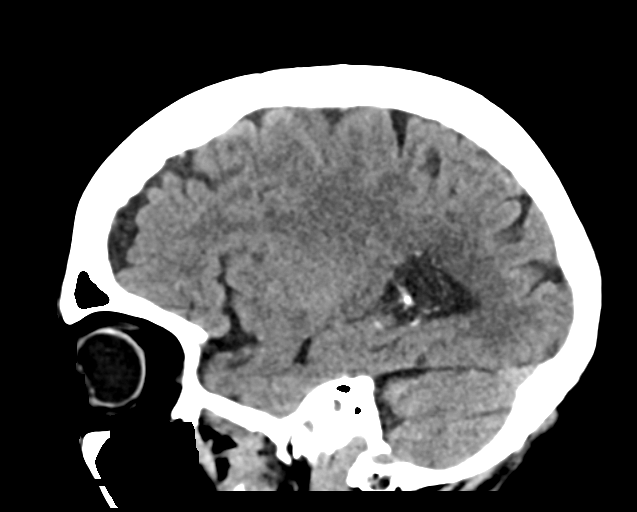
[im 27/53  brain]
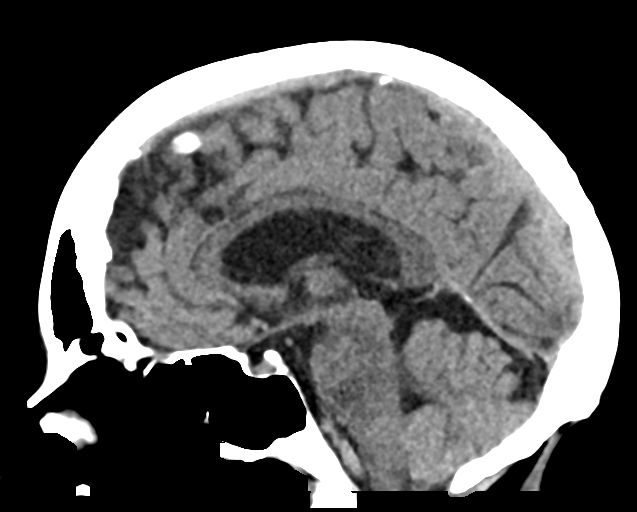
[im 35/53  brain]
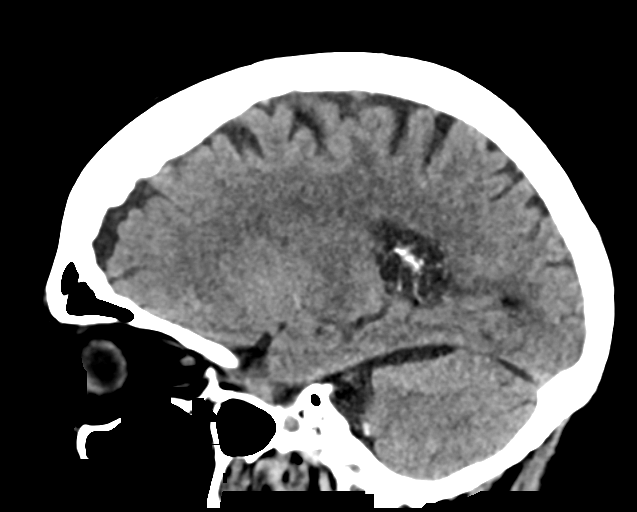

[16 of 47 positions shown; findings below may reference images not displayed]

FINDINGS: Brain: Moderate, likely age-appropriate, atrophy with sulcal
prominence and prominence of the bifrontal extra-axial spaces.
Scattered periventricular hypodensities compatible with
microvascular ischemic disease. Bilateral basal ganglial
calcifications. Given background parenchymal abnormalities, there is
no CT evidence of superimposed acute large territory infarct. No
intraparenchymal or extra-axial mass or hemorrhage. Normal size and
configuration of the ventricles and the basilar cisterns. No midline
shift.

Vascular: Intracranial atherosclerosis.

Skull: No displaced calvarial fracture with special attention paid
to the left orbit and frontal calvarium.

Sinuses/Orbits: Limited visualization the paranasal sinuses and
mastoid air cells is normal. Post right-sided cataract surgery.
Normal noncontrast appearance of the left orbit.

Other: Focal soft tissue swelling about the superolateral aspect of
the left orbit (image 13, series 3). No radiopaque foreign body.
IMPRESSION: 1. Focal soft tissue swelling about the superolateral aspect of the
left orbit without associated fracture, radiopaque foreign body or
acute intracranial process.
2. Moderate, likely age-appropriate, atrophy and microvascular
ischemic disease.

## 2019-08-28 ENCOUNTER — Ambulatory Visit (INDEPENDENT_AMBULATORY_CARE_PROVIDER_SITE_OTHER): Payer: Medicare Other | Admitting: *Deleted

## 2019-08-28 DIAGNOSIS — I442 Atrioventricular block, complete: Secondary | ICD-10-CM | POA: Diagnosis not present

## 2019-08-29 LAB — CUP PACEART REMOTE DEVICE CHECK
Battery Impedance: 645 Ohm
Battery Remaining Longevity: 81 mo
Battery Voltage: 2.79 V
Brady Statistic AP VP Percent: 91 %
Brady Statistic AP VS Percent: 0 %
Brady Statistic AS VP Percent: 9 %
Brady Statistic AS VS Percent: 0 %
Date Time Interrogation Session: 20210722135842
Implantable Lead Implant Date: 20100511
Implantable Lead Implant Date: 20100511
Implantable Lead Location: 753859
Implantable Lead Location: 753860
Implantable Lead Model: 4469
Implantable Lead Model: 4470
Implantable Lead Serial Number: 525535
Implantable Lead Serial Number: 640302
Implantable Pulse Generator Implant Date: 20140801
Lead Channel Impedance Value: 586 Ohm
Lead Channel Impedance Value: 643 Ohm
Lead Channel Pacing Threshold Amplitude: 0.25 V
Lead Channel Pacing Threshold Amplitude: 0.5 V
Lead Channel Pacing Threshold Pulse Width: 0.4 ms
Lead Channel Pacing Threshold Pulse Width: 0.4 ms
Lead Channel Setting Pacing Amplitude: 1.5 V
Lead Channel Setting Pacing Amplitude: 2 V
Lead Channel Setting Pacing Pulse Width: 0.4 ms
Lead Channel Setting Sensing Sensitivity: 2 mV

## 2019-09-01 NOTE — Progress Notes (Signed)
Remote pacemaker transmission.   

## 2019-09-04 DIAGNOSIS — Z0000 Encounter for general adult medical examination without abnormal findings: Secondary | ICD-10-CM | POA: Diagnosis not present

## 2019-09-04 DIAGNOSIS — E7849 Other hyperlipidemia: Secondary | ICD-10-CM | POA: Diagnosis not present

## 2019-09-08 DIAGNOSIS — I129 Hypertensive chronic kidney disease with stage 1 through stage 4 chronic kidney disease, or unspecified chronic kidney disease: Secondary | ICD-10-CM | POA: Diagnosis not present

## 2019-09-08 DIAGNOSIS — E785 Hyperlipidemia, unspecified: Secondary | ICD-10-CM | POA: Diagnosis not present

## 2019-09-08 DIAGNOSIS — I10 Essential (primary) hypertension: Secondary | ICD-10-CM | POA: Diagnosis not present

## 2019-09-08 DIAGNOSIS — D692 Other nonthrombocytopenic purpura: Secondary | ICD-10-CM | POA: Diagnosis not present

## 2019-09-08 DIAGNOSIS — R82998 Other abnormal findings in urine: Secondary | ICD-10-CM | POA: Diagnosis not present

## 2019-09-08 DIAGNOSIS — M47819 Spondylosis without myelopathy or radiculopathy, site unspecified: Secondary | ICD-10-CM | POA: Diagnosis not present

## 2019-09-08 DIAGNOSIS — K921 Melena: Secondary | ICD-10-CM | POA: Diagnosis not present

## 2019-09-08 DIAGNOSIS — Z0000 Encounter for general adult medical examination without abnormal findings: Secondary | ICD-10-CM | POA: Diagnosis not present

## 2019-09-15 DIAGNOSIS — K921 Melena: Secondary | ICD-10-CM | POA: Diagnosis not present

## 2019-09-16 ENCOUNTER — Other Ambulatory Visit: Payer: Self-pay | Admitting: Internal Medicine

## 2019-09-16 DIAGNOSIS — R195 Other fecal abnormalities: Secondary | ICD-10-CM

## 2019-10-02 ENCOUNTER — Other Ambulatory Visit: Payer: Self-pay

## 2019-10-02 ENCOUNTER — Ambulatory Visit
Admission: RE | Admit: 2019-10-02 | Discharge: 2019-10-02 | Disposition: A | Discharge status: Home / Self Care | Payer: Medicare Other | Source: Ambulatory Visit | Attending: Internal Medicine | Admitting: Internal Medicine

## 2019-10-02 DIAGNOSIS — R195 Other fecal abnormalities: Secondary | ICD-10-CM

## 2019-10-02 DIAGNOSIS — K5730 Diverticulosis of large intestine without perforation or abscess without bleeding: Secondary | ICD-10-CM | POA: Diagnosis not present

## 2019-10-02 MED ORDER — IOPAMIDOL (ISOVUE-300) INJECTION 61%
100.0000 mL | Freq: Once | INTRAVENOUS | Status: AC | PRN
  Administered 2019-10-02: 100 mL via INTRAVENOUS

## 2019-11-02 DIAGNOSIS — I700 Atherosclerosis of aorta: Secondary | ICD-10-CM | POA: Insufficient documentation

## 2019-11-02 NOTE — Progress Notes (Addendum)
Cardiology Office Note    Date:  11/03/2019   ID:  Toni, Sanders 05/16/1922, MRN [id number]  PCP:  Burnard Bunting, MD  Cardiologist:   Sanda Klein, MD   Chief Complaint  Patient presents with  . Atrial Fibrillation  . Pacemaker Check    History of Present Illness:  Toni Sanders is a 86 y.o. female with complete heart block, dual-chamber permanent pacemaker, systemic hypertension, history of paroxysmal atrial flutter and atrial fibrillation and history of rare nonsustained VT, returning for follow-up.  She has had another very difficult year socially.  Her husband passed away about 3 years ago.  Last year she lost her oldest brother.  This year her younger brother and her daughter passed away within a week of each other.  She had been helping her daughter through her final months of metastatic breast cancer.  It was a very difficult experience.  She has not had chest pain at rest or with activity, shortness of breath at rest or with activity or any further syncopal events.  She denies palpitations, claudication, leg edema or focal neurological events.  She has not had any falls, injuries or recent bleeding.  She does have some limited use of her right arm due to severe right shoulder pain and inability to abduct beyond the horizontal.  This sounds like she may have a rotator cuff tear.  The patient specifically denies any chest pain at rest exertion, dyspnea at rest or with exertion, orthopnea, paroxysmal nocturnal dyspnea, syncope, palpitations, focal neurological deficits, intermittent claudication, lower extremity edema, unexplained weight gain, cough, hemoptysis or wheezing.  She is pacemaker dependent due to complete heart block.  Interrogation of her device shows normal function. Her Medtronic Adapta pacemaker was implanted in 2014 and still has roughly 6.5 years of longevity.  Lead parameters remain excellent.  She has 91.6 % atrial pacing and 100% ventricular pacing.  The burden of atrial fibrillation is 6.5%, but this is based on the average since August 2019.  She is clearly had a further increase in the burden of atrial fibrillation in the last 3 to 4 months including an episode lasted for about a week in August and for 5 days in September.  All of these events have been asymptomatic.  In fact she is in atrial fibrillation at presentation today, with an episode SR about 2.5 hours before presentation.  She has rare episodes of brief nonsustained VT, always asymptomatic.  Past Medical History:  Diagnosis Date  . Atrial flutter ([place]) 2010  . Bradycardia 2010  . Complete heart block ([place]) 2010   s/p PPM by Dr Doreatha Lew (MDT) 06/16/08  . H/O cardiovascular stress test 09/22/10   no ischemia, EF >60%  . LVH (left ventricular hypertrophy) 09/22/10   Echo >55% mild LVH   . Osteoporosis     Past Surgical History:  Procedure Laterality Date  . CHOLECYSTECTOMY  1994  . PACEMAKER GENERATOR CHANGE  09/06/12   new medtronic generator Adapta L secondary to early battery depletion  . PACEMAKER GENERATOR CHANGE N/A 09/06/2012   Procedure: PACEMAKER GENERATOR CHANGE;  Surgeon: Sanda Klein, MD;  Location: [facility];  Service: Cardiovascular;  Laterality: N/A;  . PACEMAKER PLACEMENT  06/16/08   Implantation of dual-chamber Medtronic PPMY by Dr Doreatha Lew    Current Medications: Outpatient Medications Prior to Visit  Medication Sig Dispense Refill  . acetaminophen (TYLENOL) 325 MG tablet Take 650 mg by mouth every 6 (six) hours as needed.    Marland Kitchen  fish oil-omega-3 fatty acids 1000 MG capsule Take 1 g by mouth daily. 1 tab po qd    . metoprolol succinate (TOPROL-XL) 50 MG 24 hr tablet TAKE ONE AND ONE-HALF TABLETS DAILY 45 tablet 6  . XARELTO 15 MG TABS tablet TAKE ONE TABLET DAILY WITH SUPPER 90 tablet 1   No facility-administered medications prior to visit.     Allergies:   Anesthetics, amide; Anesthetics, ester; Anesthetics, halogenated; and Morphine and related    Social History   Socioeconomic History  . Marital status: Married    Spouse name: Not on file  . Number of children: Not on file  . Years of education: Not on file  . Highest education level: Not on file  Occupational History  . Not on file  Tobacco Use  . Smoking status: Never Smoker  . Smokeless tobacco: Never Used  Substance and Sexual Activity  . Alcohol use: No  . Drug use: No  . Sexual activity: Not on file  Other Topics Concern  . Not on file  Social History Narrative   Lives with spouse in [place].   Social Determinants of Health   Financial Resource Strain:   . Difficulty of Paying Living Expenses: Not on file  Food Insecurity:   . Worried About Charity fundraiser in the Last Year: Not on file  . Ran Out of Food in the Last Year: Not on file  Transportation Needs:   . Lack of Transportation (Medical): Not on file  . Lack of Transportation (Non-Medical): Not on file  Physical Activity:   . Days of Exercise per Week: Not on file  . Minutes of Exercise per Session: Not on file  Stress:   . Feeling of Stress : Not on file  Social Connections:   . Frequency of Communication with Friends and Family: Not on file  . Frequency of Social Gatherings with Friends and Family: Not on file  . Attends Religious Services: Not on file  . Active Member of Clubs or Organizations: Not on file  . Attends Archivist Meetings: Not on file  . Marital Status: Not on file     Family History:  The patient's family history includes Cancer - Ovarian in her maternal grandmother; Cancer - Prostate in her father; Hypertension in her unknown relative; Pneumonia in her paternal grandmother.   ROS:   Please see the history of present illness.    ROS All other systems are reviewed and are negative.   PHYSICAL EXAM:   VS:  BP (!) 143/72   Pulse 67   Ht 5\' 1"  (1.549 m)   Wt 131 lb 9.6 oz (59.7 kg)   SpO2 99%   BMI 24.87 kg/m      General: Alert, oriented x3, no  distress, healthy left subclavian pacemaker site Head: no evidence of trauma, PERRL, EOMI, no exophtalmos or lid lag, no myxedema, no xanthelasma; normal ears, nose and oropharynx Neck: normal jugular venous pulsations and no hepatojugular reflux; brisk carotid pulses without delay and no carotid bruits Chest: clear to auscultation, no signs of consolidation by percussion or palpation, normal fremitus, symmetrical and full respiratory excursions Cardiovascular: normal position and quality of the apical impulse, regular rhythm, normal first and paradoxically split second heart sounds, no murmurs, rubs or gallops Abdomen: no tenderness or distention, no masses by palpation, no abnormal pulsatility or arterial bruits, normal bowel sounds, no hepatosplenomegaly Extremities: no clubbing, cyanosis or edema; 2+ radial, ulnar and brachial pulses bilaterally; 2+ right  femoral, posterior tibial and dorsalis pedis pulses; 2+ left femoral, posterior tibial and dorsalis pedis pulses; no subclavian or femoral bruits Neurological: grossly nonfocal Psych: Normal mood and affect    Wt Readings from Last 3 Encounters:  11/03/19 131 lb 9.6 oz (59.7 kg)  10/07/18 132 lb 6.4 oz (60.1 kg)  04/05/18 130 lb (59 kg)      Studies/Labs Reviewed:   EKG:  EKG is ordered today and it shows background atrial fibrillation with 100% ventricular pacing ASSESSMENT:    1. CHB (complete heart block) ([place])   2. Pacemaker   3. Paroxysmal atrial fibrillation (HCC)   4. NSVT (nonsustained ventricular tachycardia) ([place])   5. Essential hypertension   6. Vasovagal syncope   7. Long term current use of anticoagulant   8. Arthropathy of right hip   9. Atherosclerosis of aorta (HCC)      PLAN:  In order of problems listed above:  1. CHB: She is pacemaker dependent. 2. PPM: Normal pacemaker function, continue remote downloads every 3 months and yearly office visit. 3. AFib: substantial increase in the burden of atrial  fibrillation in the last few months (due to family events?), remains asymptomatic. Appropriately anticoagulated CHADSVasc 4 (age 63, gender, HTN). 4. NSVT: Asymptomatic. None seen recently.  Relatively lengthy episode recorded by her pacemaker in 2016, a brief episode in January 2019 and in April 2020. Continue beta blocker. 5. HTN: minimal increase in SBP, no change to meds. 6. Vasovagal syncope: None in over a year. This is an infrequent but lifelong complaint.  She can recognize the prodrome.  Advised her to lay down immediately when this occurs to avoid falls and injuries. 7. Xarelto: She has not had any falls.  Discussed the fact that frequent falls might lead [place] to reconsider the indication for anticoagulation. She had a CT abd/pelvis for blood in the stool  8/26, identifying diverticulosis, no obvious masses.   8. R hip pain: CT showed severe R hip arthropathy and thoracolumbar spondylosis with multiple areas of foraminal impingement. 9. Aortic and coronary atherosclerosis: incidentally noted on CT. In the absence of symptoms, the focus is on risk factor control.    Medication Adjustments/Labs and Tests Ordered: Current medicines are reviewed at length with the patient today.  Concerns regarding medicines are outlined above.  Medication changes, Labs and Tests ordered today are listed in the Patient Instructions below. Patient Instructions  Medication Instructions:  No changes *If you need a refill on your cardiac medications before your next appointment, please call your pharmacy*   Lab Work: None ordered If you have labs (blood work) drawn today and your tests are completely normal, you will receive your results only by: [place] Kitchen MyChart Message (if you have MyChart) OR . A paper copy in the mail If you have any lab test that is abnormal or we need to change your treatment, we will call you to review the results.   Testing/Procedures: None ordered   Follow-Up: At [facility], you  and your health needs are our priority.  As part of our continuing mission to provide you with exceptional heart care, we have created designated Provider Care Teams.  These Care Teams include your primary Cardiologist (physician) and Advanced Practice Providers (APPs -  Physician Assistants and Nurse Practitioners) who all work together to provide you with the care you need, when you need it.  We recommend signing up for the patient portal called "MyChart".  Sign up information is provided on this After Visit  Summary.  MyChart is used to connect with patients for Virtual Visits (Telemedicine).  Patients are able to view lab/test results, encounter notes, upcoming appointments, etc.  Non-urgent messages can be sent to your provider as well.   To learn more about what you can do with MyChart, go to [email or web address].    Your next appointment:   12 month(s)  The format for your next appointment:   In Person  Provider:   Sanda Klein, MD     Signed, Sanda Klein, MD  11/03/2019 1:28 PM    [facility] [address], [place], [place]  [address] Phone: [phone]; Fax: [phone]

## 2019-11-03 ENCOUNTER — Encounter: Payer: Self-pay | Admitting: Cardiovascular Disease

## 2019-11-03 ENCOUNTER — Other Ambulatory Visit: Payer: Self-pay

## 2019-11-03 ENCOUNTER — Ambulatory Visit (INDEPENDENT_AMBULATORY_CARE_PROVIDER_SITE_OTHER): Payer: Medicare Other | Admitting: Cardiovascular Disease

## 2019-11-03 VITALS — BP 143/72 | HR 67 | Ht 61.0 in | Wt 131.6 lb

## 2019-11-03 DIAGNOSIS — I442 Atrioventricular block, complete: Secondary | ICD-10-CM | POA: Diagnosis not present

## 2019-11-03 DIAGNOSIS — M1611 Unilateral primary osteoarthritis, right hip: Secondary | ICD-10-CM

## 2019-11-03 DIAGNOSIS — I700 Atherosclerosis of aorta: Secondary | ICD-10-CM

## 2019-11-03 DIAGNOSIS — I480 Paroxysmal atrial fibrillation: Secondary | ICD-10-CM

## 2019-11-03 DIAGNOSIS — I4729 Other ventricular tachycardia: Secondary | ICD-10-CM

## 2019-11-03 DIAGNOSIS — Z7901 Long term (current) use of anticoagulants: Secondary | ICD-10-CM

## 2019-11-03 DIAGNOSIS — I472 Ventricular tachycardia: Secondary | ICD-10-CM

## 2019-11-03 DIAGNOSIS — R55 Syncope and collapse: Secondary | ICD-10-CM

## 2019-11-03 DIAGNOSIS — Z950 Presence of cardiac pacemaker: Secondary | ICD-10-CM | POA: Diagnosis not present

## 2019-11-03 DIAGNOSIS — I10 Essential (primary) hypertension: Secondary | ICD-10-CM

## 2019-11-03 NOTE — Patient Instructions (Signed)

## 2019-12-03 ENCOUNTER — Other Ambulatory Visit: Payer: Self-pay | Admitting: Cardiovascular Disease

## 2019-12-03 DIAGNOSIS — I480 Paroxysmal atrial fibrillation: Secondary | ICD-10-CM

## 2019-12-03 NOTE — Telephone Encounter (Signed)
Prescription refill request for Xarelto received.  Indication:  A fib Last office visit: 11/03/19 Weight:  131# Age: 86 Scr: 0.8 CrCl: 39 ml/min

## 2020-01-05 ENCOUNTER — Other Ambulatory Visit: Payer: Self-pay | Admitting: Cardiovascular Disease

## 2020-01-14 LAB — CUP PACEART REMOTE DEVICE CHECK
Battery Impedance: 795 Ohm
Battery Remaining Longevity: 73 mo
Battery Voltage: 2.77 V
Brady Statistic AP VP Percent: 90 %
Brady Statistic AP VS Percent: 0 %
Brady Statistic AS VP Percent: 10 %
Brady Statistic AS VS Percent: 0 %
Date Time Interrogation Session: 20211208124940
Implantable Lead Implant Date: 20100511
Implantable Lead Implant Date: 20100511
Implantable Lead Location: 753859
Implantable Lead Location: 753860
Implantable Lead Model: 4469
Implantable Lead Model: 4470
Implantable Lead Serial Number: 525535
Implantable Lead Serial Number: 640302
Implantable Pulse Generator Implant Date: 20140801
Lead Channel Impedance Value: 603 Ohm
Lead Channel Impedance Value: 644 Ohm
Lead Channel Pacing Threshold Amplitude: 0.25 V
Lead Channel Pacing Threshold Amplitude: 0.5 V
Lead Channel Pacing Threshold Pulse Width: 0.4 ms
Lead Channel Pacing Threshold Pulse Width: 0.4 ms
Lead Channel Setting Pacing Amplitude: 1.5 V
Lead Channel Setting Pacing Amplitude: 2 V
Lead Channel Setting Pacing Pulse Width: 0.4 ms
Lead Channel Setting Sensing Sensitivity: 2 mV

## 2020-01-15 ENCOUNTER — Ambulatory Visit (INDEPENDENT_AMBULATORY_CARE_PROVIDER_SITE_OTHER): Payer: Medicare Other

## 2020-01-15 DIAGNOSIS — I442 Atrioventricular block, complete: Secondary | ICD-10-CM

## 2020-01-28 NOTE — Progress Notes (Signed)
Remote pacemaker transmission.   

## 2020-03-17 DIAGNOSIS — I480 Paroxysmal atrial fibrillation: Secondary | ICD-10-CM | POA: Diagnosis not present

## 2020-03-17 DIAGNOSIS — I129 Hypertensive chronic kidney disease with stage 1 through stage 4 chronic kidney disease, or unspecified chronic kidney disease: Secondary | ICD-10-CM | POA: Diagnosis not present

## 2020-03-17 DIAGNOSIS — N182 Chronic kidney disease, stage 2 (mild): Secondary | ICD-10-CM | POA: Diagnosis not present

## 2020-03-17 DIAGNOSIS — M25462 Effusion, left knee: Secondary | ICD-10-CM | POA: Diagnosis not present

## 2020-04-15 ENCOUNTER — Ambulatory Visit (INDEPENDENT_AMBULATORY_CARE_PROVIDER_SITE_OTHER): Payer: Medicare Other

## 2020-04-15 DIAGNOSIS — I442 Atrioventricular block, complete: Secondary | ICD-10-CM | POA: Diagnosis not present

## 2020-04-18 LAB — CUP PACEART REMOTE DEVICE CHECK
Battery Impedance: 846 Ohm
Battery Remaining Longevity: 70 mo
Battery Voltage: 2.77 V
Brady Statistic AP VP Percent: 90 %
Brady Statistic AP VS Percent: 0 %
Brady Statistic AS VP Percent: 10 %
Brady Statistic AS VS Percent: 0 %
Date Time Interrogation Session: 20220310144916
Implantable Lead Implant Date: 20100511
Implantable Lead Implant Date: 20100511
Implantable Lead Location: 753859
Implantable Lead Location: 753860
Implantable Lead Model: 4469
Implantable Lead Model: 4470
Implantable Lead Serial Number: 525535
Implantable Lead Serial Number: 640302
Implantable Pulse Generator Implant Date: 20140801
Lead Channel Impedance Value: 593 Ohm
Lead Channel Impedance Value: 601 Ohm
Lead Channel Pacing Threshold Amplitude: 0.25 V
Lead Channel Pacing Threshold Amplitude: 0.5 V
Lead Channel Pacing Threshold Pulse Width: 0.4 ms
Lead Channel Pacing Threshold Pulse Width: 0.4 ms
Lead Channel Setting Pacing Amplitude: 1.5 V
Lead Channel Setting Pacing Amplitude: 2 V
Lead Channel Setting Pacing Pulse Width: 0.4 ms
Lead Channel Setting Sensing Sensitivity: 2 mV

## 2020-04-23 NOTE — Progress Notes (Signed)
Remote pacemaker transmission.   

## 2020-06-02 ENCOUNTER — Other Ambulatory Visit: Payer: Self-pay | Admitting: Cardiovascular Disease

## 2020-06-02 DIAGNOSIS — I480 Paroxysmal atrial fibrillation: Secondary | ICD-10-CM

## 2020-06-02 NOTE — Telephone Encounter (Signed)
74f, 59.7kg, Creatinine, Serum 0.800 mg/ 09/04/2019, lovw/croitoru 11/03/19. ccr 39.6

## 2020-06-29 ENCOUNTER — Other Ambulatory Visit (HOSPITAL_BASED_OUTPATIENT_CLINIC_OR_DEPARTMENT_OTHER): Payer: Self-pay

## 2020-06-29 ENCOUNTER — Ambulatory Visit: Payer: Medicare Other | Attending: Internal Medicine

## 2020-06-29 ENCOUNTER — Other Ambulatory Visit: Payer: Self-pay

## 2020-06-29 DIAGNOSIS — Z23 Encounter for immunization: Secondary | ICD-10-CM

## 2020-06-29 MED ORDER — PFIZER-BIONT COVID-19 VAC-TRIS 30 MCG/0.3ML IM SUSP
INTRAMUSCULAR | 0 refills | Status: DC
  Filled 2020-06-29: qty 0.3, 1d supply, fill #0

## 2020-06-29 NOTE — Progress Notes (Signed)
   Covid-19 Vaccination Clinic  Name:  Toni Sanders    MRN: [id number] DOB: 05-21-1922  06/29/2020  Toni Sanders was observed post Covid-19 immunization for 15 minutes without incident. She was provided with Vaccine Information Sheet and instruction to access the V-Safe system.   Toni Sanders was instructed to call 911 with any severe reactions post vaccine: Marland Kitchen Difficulty breathing  . Swelling of face and throat  . A fast heartbeat  . A bad rash all over body  . Dizziness and weakness   Immunizations Administered    Name Date Dose VIS Date Route   PFIZER Comrnaty(Gray TOP) Covid-19 Vaccine 06/29/2020  1:29 PM 0.3 mL 01/15/2020 Intramuscular   Manufacturer: [employer]   Lot: [id number]   NDC: [phone]

## 2020-07-15 ENCOUNTER — Ambulatory Visit (INDEPENDENT_AMBULATORY_CARE_PROVIDER_SITE_OTHER): Payer: Medicare Other

## 2020-07-15 DIAGNOSIS — I442 Atrioventricular block, complete: Secondary | ICD-10-CM

## 2020-07-15 LAB — CUP PACEART REMOTE DEVICE CHECK
Battery Impedance: 950 Ohm
Battery Remaining Longevity: 64 mo
Battery Voltage: 2.77 V
Brady Statistic AP VP Percent: 90 %
Brady Statistic AP VS Percent: 0 %
Brady Statistic AS VP Percent: 10 %
Brady Statistic AS VS Percent: 0 %
Date Time Interrogation Session: 20220609132045
Implantable Lead Implant Date: 20100511
Implantable Lead Implant Date: 20100511
Implantable Lead Location: 753859
Implantable Lead Location: 753860
Implantable Lead Model: 4469
Implantable Lead Model: 4470
Implantable Lead Serial Number: 525535
Implantable Lead Serial Number: 640302
Implantable Pulse Generator Implant Date: 20140801
Lead Channel Impedance Value: 565 Ohm
Lead Channel Impedance Value: 574 Ohm
Lead Channel Pacing Threshold Amplitude: 0.25 V
Lead Channel Pacing Threshold Amplitude: 0.5 V
Lead Channel Pacing Threshold Pulse Width: 0.4 ms
Lead Channel Pacing Threshold Pulse Width: 0.4 ms
Lead Channel Setting Pacing Amplitude: 1.5 V
Lead Channel Setting Pacing Amplitude: 2 V
Lead Channel Setting Pacing Pulse Width: 0.4 ms
Lead Channel Setting Sensing Sensitivity: 2 mV

## 2020-08-06 NOTE — Progress Notes (Signed)
Remote pacemaker transmission.   

## 2020-09-30 DIAGNOSIS — E785 Hyperlipidemia, unspecified: Secondary | ICD-10-CM | POA: Diagnosis not present

## 2020-10-04 DIAGNOSIS — R82998 Other abnormal findings in urine: Secondary | ICD-10-CM | POA: Diagnosis not present

## 2020-10-14 ENCOUNTER — Ambulatory Visit (INDEPENDENT_AMBULATORY_CARE_PROVIDER_SITE_OTHER): Payer: Medicare Other

## 2020-10-14 DIAGNOSIS — I442 Atrioventricular block, complete: Secondary | ICD-10-CM | POA: Diagnosis not present

## 2020-10-15 LAB — CUP PACEART REMOTE DEVICE CHECK
Battery Impedance: 1028 Ohm
Battery Remaining Longevity: 62 mo
Battery Voltage: 2.77 V
Brady Statistic AP VP Percent: 90 %
Brady Statistic AP VS Percent: 0 %
Brady Statistic AS VP Percent: 10 %
Brady Statistic AS VS Percent: 0 %
Date Time Interrogation Session: 20220908151140
Implantable Lead Implant Date: 20100511
Implantable Lead Implant Date: 20100511
Implantable Lead Location: 753859
Implantable Lead Location: 753860
Implantable Lead Model: 4469
Implantable Lead Model: 4470
Implantable Lead Serial Number: 525535
Implantable Lead Serial Number: 640302
Implantable Pulse Generator Implant Date: 20140801
Lead Channel Impedance Value: 613 Ohm
Lead Channel Impedance Value: 619 Ohm
Lead Channel Pacing Threshold Amplitude: 0.25 V
Lead Channel Pacing Threshold Amplitude: 0.375 V
Lead Channel Pacing Threshold Pulse Width: 0.4 ms
Lead Channel Pacing Threshold Pulse Width: 0.4 ms
Lead Channel Setting Pacing Amplitude: 1.5 V
Lead Channel Setting Pacing Amplitude: 2 V
Lead Channel Setting Pacing Pulse Width: 0.4 ms
Lead Channel Setting Sensing Sensitivity: 2 mV

## 2020-10-22 NOTE — Progress Notes (Signed)
Remote pacemaker transmission.   

## 2020-11-22 ENCOUNTER — Other Ambulatory Visit: Payer: Self-pay

## 2020-11-22 ENCOUNTER — Ambulatory Visit (INDEPENDENT_AMBULATORY_CARE_PROVIDER_SITE_OTHER): Payer: Medicare Other | Admitting: Cardiovascular Disease

## 2020-11-22 VITALS — BP 141/63 | HR 68 | Ht 61.0 in | Wt 130.6 lb

## 2020-11-22 DIAGNOSIS — I4811 Longstanding persistent atrial fibrillation: Secondary | ICD-10-CM | POA: Diagnosis not present

## 2020-11-22 DIAGNOSIS — Z950 Presence of cardiac pacemaker: Secondary | ICD-10-CM

## 2020-11-22 DIAGNOSIS — I10 Essential (primary) hypertension: Secondary | ICD-10-CM

## 2020-11-22 DIAGNOSIS — I4729 Other ventricular tachycardia: Secondary | ICD-10-CM

## 2020-11-22 DIAGNOSIS — I700 Atherosclerosis of aorta: Secondary | ICD-10-CM

## 2020-11-22 DIAGNOSIS — Z7901 Long term (current) use of anticoagulants: Secondary | ICD-10-CM

## 2020-11-22 DIAGNOSIS — R55 Syncope and collapse: Secondary | ICD-10-CM

## 2020-11-22 DIAGNOSIS — I442 Atrioventricular block, complete: Secondary | ICD-10-CM

## 2020-11-22 NOTE — Patient Instructions (Signed)

## 2020-11-22 NOTE — Progress Notes (Signed)
Cardiology Office Note    Date:  11/23/2020   ID:  Toni Sanders, DOB 1922-12-13, MRN [id number]  PCP:  Burnard Bunting, MD  Cardiologist:   Sanda Klein, MD   Chief Complaint  Patient presents with   Atrial Fibrillation         History of Present Illness:  Toni Sanders is a 87 y.o. female with complete heart block, dual-chamber permanent pacemaker, systemic hypertension, history of paroxysmal atrial flutter and atrial fibrillation and history of rare nonsustained VT, returning for follow-up.  Generally doing well.  Continues to live independently in her home, although she can no longer drive.  Her daughter lives in town and helps her with groceries and other needs and is very supportive.  The patient prefers to continue living in her own home.  She is limited primarily by right hip and right knee pain and walks very cautiously.  She also has limited use of her right shoulder which she cannot abduct above the horizontal.  Pacemaker interrogation shows that she has been on uninterrupted atrial fibrillation for well over 6 months and she is completely unaware of it.  She has complete heart block and does not have any underlying escape rhythm at 35 bpm.  She has 100% ventricular paced rhythm.  She has had previous problems with nonsustained VT, particularly a 22 beat run that occurred November 2021.  No meaningful episode has been recorded since.  The patient specifically denies any chest pain at rest exertion, dyspnea at rest or with exertion, orthopnea, paroxysmal nocturnal dyspnea, syncope, palpitations, focal neurological deficits, intermittent claudication, lower extremity edema, unexplained weight gain, cough, hemoptysis or wheezing.  Interrogation of her device shows normal function. Her Medtronic Adapta pacemaker was implanted in 2014 and still has roughly 4 years of longevity.  Lead parameters remain excellent.  Virtually 100% atrial fibrillation and ventricular pacing since  her last evaluation.  Past Medical History:  Diagnosis Date   Atrial flutter ([employer]) 2010   Bradycardia 2010   Complete heart block ([place]) 2010   s/p PPM by Dr Doreatha Lew ([facility]) 06/16/08   H/O cardiovascular stress test 09/22/10   no ischemia, EF >60%   LVH (left ventricular hypertrophy) 09/22/10   Echo >55% mild LVH    Osteoporosis     Past Surgical History:  Procedure Laterality Date   CHOLECYSTECTOMY  1994   PACEMAKER GENERATOR CHANGE  09/06/12   new medtronic generator Adapta L secondary to early battery depletion   PACEMAKER GENERATOR CHANGE N/A 09/06/2012   Procedure: PACEMAKER GENERATOR CHANGE;  Surgeon: Sanda Klein, MD;  Location: [facility] CATH [facility];  Service: Cardiovascular;  Laterality: N/A;   PACEMAKER PLACEMENT  06/16/08   Implantation of dual-chamber Medtronic PPMY by Dr Doreatha Lew    Current Medications: Outpatient Medications Prior to Visit  Medication Sig Dispense Refill   fish oil-omega-3 fatty acids 1000 MG capsule Take 1 g by mouth daily. 1 tab po qd     metoprolol succinate (TOPROL-XL) 50 MG 24 hr tablet TAKE ONE AND ONE-HALF TABLETS DAILY 45 tablet 10   XARELTO 15 MG TABS tablet TAKE ONE TABLET DAILY WITH SUPPER 90 tablet 1   acetaminophen (TYLENOL) 325 MG tablet Take 650 mg by mouth every 6 (six) hours as needed. (Patient not taking: Reported on 11/22/2020)     COVID-19 mRNA Vac-TriS, Pfizer, (PFIZER-BIONT COVID-19 VAC-TRIS) SUSP injection Inject into the muscle. (Patient not taking: Reported on 11/22/2020) 0.3 mL 0   No facility-administered medications prior to visit.  Allergies:   Anesthetics, amide; Anesthetics, ester; Anesthetics, halogenated; and Morphine and related   Social History   Socioeconomic History   Marital status: Married    Spouse name: Not on file   Number of children: Not on file   Years of education: Not on file   Highest education level: Not on file  Occupational History   Not on file  Tobacco Use   Smoking status: Never   Smokeless  tobacco: Never  Substance and Sexual Activity   Alcohol use: No   Drug use: No   Sexual activity: Not on file  Other Topics Concern   Not on file  Social History Narrative   Lives with spouse in [place].   Social Determinants of Health   Financial Resource Strain: Not on file  Food Insecurity: Not on file  Transportation Needs: Not on file  Physical Activity: Not on file  Stress: Not on file  Social Connections: Not on file     Family History:  The patient's family history includes Cancer - Ovarian in her maternal grandmother; Cancer - Prostate in her father; Hypertension in her unknown relative; Pneumonia in her paternal grandmother.   ROS:   Please see the history of present illness.    ROS All other systems are reviewed and are negative.   PHYSICAL EXAM:   VS:  BP (!) 141/63 (BP Location: Left Arm, Patient Position: Sitting, Cuff Size: Normal)   Pulse 68   Ht 5\' 1"  (1.549 m)   Wt 130 lb 9.6 oz (59.2 kg)   SpO2 96%   BMI 24.68 kg/m      General: Alert, oriented x3, no distress, elderly.  Healthy left subclavian pacemaker site Head: no evidence of trauma, PERRL, EOMI, no exophtalmos or lid lag, no myxedema, no xanthelasma; normal ears, nose and oropharynx Neck: normal jugular venous pulsations and no hepatojugular reflux; brisk carotid pulses without delay and no carotid bruits Chest: clear to auscultation, no signs of consolidation by percussion or palpation, normal fremitus, symmetrical and full respiratory excursions Cardiovascular: normal position and quality of the apical impulse, regular rhythm, normal first and second heart sounds, no murmurs, rubs or gallops Abdomen: no tenderness or distention, no masses by palpation, no abnormal pulsatility or arterial bruits, normal bowel sounds, no hepatosplenomegaly Extremities: no clubbing, cyanosis or edema; 2+ radial, ulnar and brachial pulses bilaterally; 2+ right femoral, posterior tibial and dorsalis pedis pulses; 2+  left femoral, posterior tibial and dorsalis pedis pulses; no subclavian or femoral bruits Neurological: grossly nonfocal Psych: Normal mood and affect     Wt Readings from Last 3 Encounters:  11/22/20 130 lb 9.6 oz (59.2 kg)  11/03/19 131 lb 9.6 oz (59.7 kg)  10/07/18 132 lb 6.4 oz (60.1 kg)      Studies/Labs Reviewed:   EKG:  EKG shows 100% atrial fibrillation and ventricular pacing ASSESSMENT:    1. CHB (complete heart block) (HCC)   2. Pacemaker   3. Longstanding persistent atrial fibrillation ([place])   4. NSVT (nonsustained ventricular tachycardia)   5. Essential hypertension   6. Vasovagal syncope   7. Long term current use of anticoagulant   8. Atherosclerosis of aorta (HCC)      PLAN:  In order of problems listed above:  CHB: She is pacemaker dependent.  There is no escape rhythm when pacing is taken down to 35 bpm. PPM: Normal device function.  Continue remote downloads every 3 months.  Switch to VVIR mode today. AFib: Longstanding persistent arrhythmia, now  likely to be permanent.  Completely asymptomatic and rate control is not an issue since she has heart block. Appropriately anticoagulated CHADSVasc 4 (age 89, gender, HTN). NSVT: Asymptomatic.  The episodes are very infrequent once or twice every 2 or 3 years, but can be lengthy, including a 22 beat episode that occurred last November.  On beta-blockers. HTN: Acceptable control Vasovagal syncope: Lifelong complaint, but very infrequent.  She has not had the episode of true syncope in over 2 years.  She is aware of the prodrome and is able to abort full-blown syncope by laying down. Xarelto: No bleeding complications and no recent falls. Aortic and coronary atherosclerosis: incidentally noted on CT. In the absence of symptoms, the focus is on risk factor control.  Hard to justify initiating statin in the absence of problems by age 28. R hip pain: CT showed severe R hip arthropathy and thoracolumbar spondylosis with  multiple areas of foraminal impingement.    Medication Adjustments/Labs and Tests Ordered: Current medicines are reviewed at length with the patient today.  Concerns regarding medicines are outlined above.  Medication changes, Labs and Tests ordered today are listed in the Patient Instructions below. Patient Instructions  Medication Instructions:  No changes *If you need a refill on your cardiac medications before your next appointment, please call your pharmacy*   Lab Work: None ordered If you have labs (blood work) drawn today and your tests are completely normal, you will receive your results only by: [place] (if you have MyChart) OR A paper copy in the mail If you have any lab test that is abnormal or we need to change your treatment, we will call you to review the results.   Testing/Procedures: None ordered   Follow-Up: At [facility], you and your health needs are our priority.  As part of our continuing mission to provide you with exceptional heart care, we have created designated Provider Care Teams.  These Care Teams include your primary Cardiologist (physician) and Advanced Practice Providers (APPs -  Physician Assistants and Nurse Practitioners) who all work together to provide you with the care you need, when you need it.  We recommend signing up for the patient portal called "MyChart".  Sign up information is provided on this After Visit Summary.  MyChart is used to connect with patients for Virtual Visits (Telemedicine).  Patients are able to view lab/test results, encounter notes, upcoming appointments, etc.  Non-urgent messages can be sent to your provider as well.   To learn more about what you can do with MyChart, go to [email or web address].    Your next appointment:   12 month(s)  The format for your next appointment:   In Person  Provider:   Sanda Klein, MD     Signed, Sanda Klein, MD  11/23/2020 2:36 PM    [address], [place], [place]  [address] Phone: [phone]; Fax: [phone]

## 2020-11-23 ENCOUNTER — Encounter: Payer: Self-pay | Admitting: Cardiovascular Disease

## 2020-11-30 ENCOUNTER — Other Ambulatory Visit: Payer: Self-pay | Admitting: Cardiovascular Disease

## 2020-11-30 DIAGNOSIS — I480 Paroxysmal atrial fibrillation: Secondary | ICD-10-CM

## 2020-12-10 ENCOUNTER — Telehealth: Payer: Self-pay | Admitting: Cardiovascular Disease

## 2020-12-10 DIAGNOSIS — Z23 Encounter for immunization: Secondary | ICD-10-CM | POA: Diagnosis not present

## 2020-12-10 DIAGNOSIS — R3129 Other microscopic hematuria: Secondary | ICD-10-CM | POA: Diagnosis not present

## 2020-12-10 DIAGNOSIS — I480 Paroxysmal atrial fibrillation: Secondary | ICD-10-CM | POA: Diagnosis not present

## 2020-12-10 NOTE — Telephone Encounter (Signed)
Called pt's daughter to inform her of the okay to hold Xarelto for 2 days then resume per PharmD

## 2020-12-10 NOTE — Telephone Encounter (Signed)
CHADSVasc score of 4.  Patient ok to hold 2 doses of Xarelto and follow up with PCP on Monday

## 2020-12-10 NOTE — Telephone Encounter (Signed)
Pt c/o medication issue:  1. Name of Medication: XARELTO 15 MG TABS tablet  2. How are you currently taking this medication (dosage and times per day)? AS DIRECTED  3. Are you having a reaction (difficulty breathing--STAT)? NO  4. What is your medication issue? PT SAW HER PCP TODAY REGARDING THE  BLOOD IN HER URINE, PT WAS ADVISED BY HER PCP TO DISCONTINUE XARELTO FOR 2 DAYS  STARTING TOMORROW AND RESUME ON Monday. PLEASE ADVISE PT'S Mitchel Honour Hagerman [phone]

## 2020-12-23 ENCOUNTER — Other Ambulatory Visit: Payer: Self-pay | Admitting: Cardiovascular Disease

## 2020-12-24 DIAGNOSIS — N644 Mastodynia: Secondary | ICD-10-CM | POA: Diagnosis not present

## 2020-12-24 DIAGNOSIS — S2002XA Contusion of left breast, initial encounter: Secondary | ICD-10-CM | POA: Diagnosis not present

## 2021-01-13 ENCOUNTER — Ambulatory Visit: Payer: Medicare Other

## 2021-03-17 ENCOUNTER — Other Ambulatory Visit (HOSPITAL_BASED_OUTPATIENT_CLINIC_OR_DEPARTMENT_OTHER): Payer: Self-pay

## 2021-04-06 DIAGNOSIS — I129 Hypertensive chronic kidney disease with stage 1 through stage 4 chronic kidney disease, or unspecified chronic kidney disease: Secondary | ICD-10-CM | POA: Diagnosis not present

## 2021-04-06 DIAGNOSIS — I700 Atherosclerosis of aorta: Secondary | ICD-10-CM | POA: Diagnosis not present

## 2021-04-06 DIAGNOSIS — J449 Chronic obstructive pulmonary disease, unspecified: Secondary | ICD-10-CM | POA: Diagnosis not present

## 2021-04-06 DIAGNOSIS — N183 Chronic kidney disease, stage 3 unspecified: Secondary | ICD-10-CM | POA: Diagnosis not present

## 2021-04-14 ENCOUNTER — Ambulatory Visit (INDEPENDENT_AMBULATORY_CARE_PROVIDER_SITE_OTHER): Payer: Medicare Other

## 2021-04-14 DIAGNOSIS — I442 Atrioventricular block, complete: Secondary | ICD-10-CM

## 2021-04-16 LAB — CUP PACEART REMOTE DEVICE CHECK
Battery Impedance: 1189 Ohm
Battery Remaining Longevity: 68 mo
Battery Voltage: 2.77 V
Brady Statistic RV Percent Paced: 100 %
Date Time Interrogation Session: 20230310132312
Implantable Lead Implant Date: 20100511
Implantable Lead Implant Date: 20100511
Implantable Lead Location: 753859
Implantable Lead Location: 753860
Implantable Lead Model: 4469
Implantable Lead Model: 4470
Implantable Lead Serial Number: 525535
Implantable Lead Serial Number: 640302
Implantable Pulse Generator Implant Date: 20140801
Lead Channel Impedance Value: 660 Ohm
Lead Channel Impedance Value: 67 Ohm
Lead Channel Pacing Threshold Amplitude: 0.5 V
Lead Channel Pacing Threshold Pulse Width: 0.4 ms
Lead Channel Setting Pacing Amplitude: 2 V
Lead Channel Setting Pacing Pulse Width: 0.4 ms
Lead Channel Setting Sensing Sensitivity: 2 mV

## 2021-04-27 NOTE — Progress Notes (Signed)
Remote pacemaker transmission.   

## 2021-06-06 ENCOUNTER — Other Ambulatory Visit: Payer: Self-pay | Admitting: Cardiovascular Disease

## 2021-06-06 DIAGNOSIS — I480 Paroxysmal atrial fibrillation: Secondary | ICD-10-CM

## 2021-06-06 NOTE — Telephone Encounter (Signed)
Prescription refill request for Xarelto received.  ?Indication:Afib ?Last office visit:10/22 ?Weight:59.2 kg ?Age:88 ?YOK:HTXHF labs ?CrCl:needs labs ? ?Prescription refilled ? ?

## 2021-06-23 ENCOUNTER — Telehealth: Payer: Self-pay | Admitting: Cardiovascular Disease

## 2021-06-23 NOTE — Telephone Encounter (Signed)
*  STAT* If patient is at the pharmacy, call can be transferred to refill team.   1. Which medications need to be refilled? (please list name of each medication and dose if known) Rivaroxaban (XARELTO) 15 MG TABS tablet  2. Which pharmacy/location (including street and city if local pharmacy) is medication to be sent to? [place], [place] - [address]  3. Do they need a 30 day or 90 day supply? 90   Patient has appt on 5/26

## 2021-06-29 NOTE — Progress Notes (Deleted)
Cardiology Office Note:    Date:  06/29/2021   ID:  Toni Sanders, DOB 09-23-1922, MRN [id number]  PCP:  Burnard Bunting, MD   [facility] Providers Cardiologist:  None { Click to update primary MD,subspecialty MD or APP then REFRESH:1}    Referring MD: Burnard Bunting, MD   Chief Complaint: ***  History of Present Illness:    Toni Sanders is a *** 88 y.o. female with a hx of complete heart block s/p dual-chamber permanent pacemaker, tension, PAF, atrial fibrillation on chronic anticoagulation, and rare nonsustained VT  Implantation of Medtronic Adapta pacemaker 2014. Irrigation shows that she has been on uninterrupted atrial fibrillation for well over 6 months and she is completely unaware of it.  She has complete heart block and does not have any underlying escape rhythm at 35 bpm.  She has 100% ventricular paced rhythm.  History of nonsustained VT, particularly a 22 beat run that occurred November 2021.  No meaningful episode has been recorded since  She was last seen in our office on 11/22/2020 by Dr. Sallyanne Kuster at which time her device showed normal function. She had no specific concerns and 81-monthfollow-up was recommended.  She called our office 12/10/2020 to report blood in her urine.  She held Xarelto for 2 days.  Today, she is here   Past Medical History:  Diagnosis Date   Atrial flutter ([place] 2010   Bradycardia 2010   Complete heart block ([place] 2010   s/p PPM by Dr TDoreatha Lew(MDT) 06/16/08   H/O cardiovascular stress test 09/22/10   no ischemia, EF >60%   LVH (left ventricular hypertrophy) 09/22/10   Echo >55% mild LVH    Osteoporosis     Past Surgical History:  Procedure Laterality Date   CHOLECYSTECTOMY  1994   PACEMAKER GENERATOR CHANGE  09/06/12   new medtronic generator Adapta L secondary to early battery depletion   PACEMAKER GENERATOR CHANGE N/A 09/06/2012   Procedure: PACEMAKER GENERATOR CHANGE;  Surgeon: MSanda Klein MD;  [facility];   Service: Cardiovascular;  Laterality: N/A;   PACEMAKER PLACEMENT  06/16/08   Implantation of dual-chamber Medtronic PPMY by Dr TDoreatha Lew   Current Medications: No outpatient medications have been marked as taking for the 07/01/21 encounter (Appointment) with SAnn Maki MLanice Schwab NP.     Allergies:   Anesthetics, amide; Anesthetics, ester; Anesthetics, halogenated; and Morphine and related   Social History   Socioeconomic History   Marital status: Married    Spouse name: Not on file   Number of children: Not on file   Years of education: Not on file   Highest education level: Not on file  Occupational History   Not on file  Tobacco Use   Smoking status: Never   Smokeless tobacco: Never  Substance and Sexual Activity   Alcohol use: No   Drug use: No   Sexual activity: Not on file  Other Topics Concern   Not on file  Social History Narrative   [place]   Social Determinants of Health   Financial Resource Strain: Not on file  Food Insecurity: Not on file  Transportation Needs: Not on file  Physical Activity: Not on file  Stress: Not on file  Social Connections: Not on file     Family History: The patient's ***family history includes Cancer - Ovarian in her maternal grandmother; Cancer - Prostate in her father; Hypertension in her unknown relative; Pneumonia in her paternal grandmother.  ROS:  Please see the history of present illness.    *** All other systems reviewed and are negative.  Labs/Other Studies Reviewed:    The following studies were reviewed today:  Echo 09/2010  LVEF > 55%, concentric LVH, pacemaker lead in right ventricle, Trace MR, Trace TR Mildly sclerotic aortic valve, trace AI, no AS  Stress Myoview 09/2010  Normal perfusion, low risk scan   Recent Labs: No results found for requested labs within last 8760 hours.  Recent Lipid Panel No results found for: CHOL, TRIG, HDL, CHOLHDL, VLDL, LDLCALC, LDLDIRECT   Risk  Assessment/Calculations:   {Does this patient have ATRIAL FIBRILLATION?:[phone]}       Physical Exam:    VS:  There were no vitals taken for this visit.    Wt Readings from Last 3 Encounters:  11/22/20 130 lb 9.6 oz (59.2 kg)  11/03/19 131 lb 9.6 oz (59.7 kg)  10/07/18 132 lb 6.4 oz (60.1 kg)     GEN: *** Well nourished, well developed in no acute distress HEENT: Normal NECK: No JVD; No carotid bruits CARDIAC: ***RRR, no murmurs, rubs, gallops RESPIRATORY:  Clear to auscultation without rales, wheezing or rhonchi  ABDOMEN: Soft, non-tender, non-distended MUSCULOSKELETAL:  No edema; No deformity. *** pedal pulses, ***bilaterally SKIN: Warm and dry NEUROLOGIC:  Alert and oriented x 3 PSYCHIATRIC:  Normal affect   EKG:  EKG is *** ordered today.  The ekg ordered today demonstrates ***  Diagnoses:    No diagnosis found. Assessment and Plan:    CHB PPM: Continue remote downloads every 3 months Longstanding persistent atrial fibrillation: NSVT Hypertension: Aortic and coronary atherosclerosis: Incidentally noted on CT.   {Are you ordering a CV Procedure (e.g. stress test, cath, DCCV, TEE, etc)?   Press F2        :[id number]}    Medication Adjustments/Labs and Tests Ordered: Current medicines are reviewed at length with the patient today.  Concerns regarding medicines are outlined above.  No orders of the defined types were placed in this encounter.  No orders of the defined types were placed in this encounter.   There are no Patient Instructions on file for this visit.   Signed, Emmaline Life, NP  06/29/2021 2:44 PM    [facility]

## 2021-07-01 ENCOUNTER — Ambulatory Visit: Payer: Medicare Other | Admitting: Nurse Practitioner

## 2021-07-01 ENCOUNTER — Telehealth: Payer: Self-pay | Admitting: *Deleted

## 2021-07-01 NOTE — Telephone Encounter (Signed)
Lvm due to pt going to the [facility] location instead of [address].  It is now 35 min and waiting.  LVM by 12:15 pt will have to R/S.

## 2021-07-03 NOTE — Progress Notes (Unsigned)
Cardiology Office Note:    Date:  07/06/2021   ID:  Toni Sanders, DOB 01-17-1923, MRN [id number]  PCP:  Burnard Bunting, MD   [facility] Providers Cardiologist:  Sanda Klein, MD     Referring MD: Burnard Bunting, MD   Chief Complaint: medication refill  History of Present Illness:    Toni Sanders is a very pleasant 88 y.o. female with a hx of complete heart block s/p dual-chamber permanent pacemaker, tension, PAF, atrial fibrillation on chronic anticoagulation, and rare nonsustained VT  Implantation of Medtronic Adapta pacemaker 2014. Irrigation shows that she has been on uninterrupted atrial fibrillation for well over 6 months and she is completely unaware of it.  She has complete heart block and does not have any underlying escape rhythm at 35 bpm.  She has 100% ventricular paced rhythm.  History of nonsustained VT, particularly a 22 beat run that occurred November 2021.  No meaningful episode has been recorded since  She was last seen in our office on 11/22/2020 by Dr. Sallyanne Kuster at which time her device showed normal function. She had no specific concerns and 38-monthfollow-up was recommended.  She called our office 12/10/2020 to report blood in her urine.  She held Xarelto for 2 days.  Today, she is here with her daughter. She reports she was told she had to come in for an office visit in order to get her medications refilled. She was scheduled to see me last week and went to the wrong office by mistake. She was very concerned about possibly running out of Xarelto.  I apologized for the confusion and offered my help in following up with our staff so that this does not occur in the future. I will also make Dr. CSallyanne Kusteraware as she just saw him in October and is not overdue for an office visit.  She has no specific cardiac concerns and denies chest pain, shortness of breath, lower extremity edema, fatigue, palpitations, melena, hematuria, hemoptysis, diaphoresis, weakness,  presyncope, syncope, orthopnea, and PND. Remains active with chair exercises and walking with the assistance of a walker.   Past Medical History:  Diagnosis Date   Atrial flutter ([place] 2010   Bradycardia 2010   Complete heart block ([place] 2010   s/p PPM by Dr TDoreatha Lew(MDT) 06/16/08   H/O cardiovascular stress test 09/22/10   no ischemia, EF >60%   LVH (left ventricular hypertrophy) 09/22/10   Echo >55% mild LVH    Osteoporosis     Past Surgical History:  Procedure Laterality Date   CHOLECYSTECTOMY  1994   PACEMAKER GENERATOR CHANGE  09/06/12   new medtronic generator Adapta L secondary to early battery depletion   PACEMAKER GENERATOR CHANGE N/A 09/06/2012   Procedure: PACEMAKER GENERATOR CHANGE;  Surgeon: MSanda Klein MD;  [facility];  Service: Cardiovascular;  Laterality: N/A;   PACEMAKER PLACEMENT  06/16/08   Implantation of dual-chamber Medtronic PPMY by Dr TDoreatha Lew   Current Medications: Current Meds  Medication Sig   acetaminophen (TYLENOL) 325 MG tablet Take 650 mg by mouth every 6 (six) hours as needed.   fish oil-omega-3 fatty acids 1000 MG capsule Take 1 g by mouth daily. 1 tab po qd   metoprolol succinate (TOPROL-XL) 50 MG 24 hr tablet TAKE ONE AND ONE-HALF TABLETS DAILY   [DISCONTINUED] Rivaroxaban (XARELTO) 15 MG TABS tablet Take 1 tablet (15 mg total) by mouth daily with supper. NEEDS LABS FOR REFILLS, PLEASE COME TO OFFICE ASAP  Allergies:   Anesthetics, amide; Anesthetics, ester; Anesthetics, halogenated; and Morphine and related   Social History   Socioeconomic History   Marital status: Married    Spouse name: Not on file   Number of children: Not on file   Years of education: Not on file   Highest education level: Not on file  Occupational History   Not on file  Tobacco Use   Smoking status: Never   Smokeless tobacco: Never  Substance and Sexual Activity   Alcohol use: No   Drug use: No   Sexual activity: Not on file  Other Topics Concern    Not on file  Social History Narrative   Lives with spouse in [place].   Social Determinants of Health   Financial Resource Strain: Not on file  Food Insecurity: Not on file  Transportation Needs: Not on file  Physical Activity: Not on file  Stress: Not on file  Social Connections: Not on file     Family History: The patient's family history includes Cancer - Ovarian in her maternal grandmother; Cancer - Prostate in her father; Hypertension in her unknown relative; Pneumonia in her paternal grandmother.  ROS:   Please see the history of present illness.   All other systems reviewed and are negative.  Labs/Other Studies Reviewed:    The following studies were reviewed today:  Echo 09/2010  LVEF > 55%, concentric LVH, pacemaker lead in right ventricle, Trace MR, Trace TR Mildly sclerotic aortic valve, trace AI, no AS  Stress Myoview 09/2010  Normal perfusion, low risk scan   Recent Labs: No results found for requested labs within last 8760 hours.  Recent Lipid Panel No results found for: CHOL, TRIG, HDL, CHOLHDL, VLDL, LDLCALC, LDLDIRECT   Risk Assessment/Calculations:    CHA2DS2-VASc Score = 4   This indicates a 4.8% annual risk of stroke. The patient's score is based upon: CHF History: 0 HTN History: 1 Diabetes History: 0 Stroke History: 0 Vascular Disease History: 0 Age Score: 2 Gender Score: 1   Physical Exam:    VS:  BP 138/70   Pulse 82   Ht '5\' 1"'$  (1.549 m)   Wt 133 lb (60.3 kg)   SpO2 98%   BMI 25.13 kg/m     Wt Readings from Last 3 Encounters:  07/06/21 133 lb (60.3 kg)  11/22/20 130 lb 9.6 oz (59.2 kg)  11/03/19 131 lb 9.6 oz (59.7 kg)     GEN:  Frail elderly female in no acute distress HEENT: Normal NECK: No JVD; No carotid bruits CARDIAC: Irregular RR, no murmurs, rubs, gallops RESPIRATORY:  Clear to auscultation without rales, wheezing or rhonchi  ABDOMEN: Soft, non-tender, non-distended MUSCULOSKELETAL:  No edema; No  deformity. 2+ pedal pulses, equal bilaterally SKIN: Warm and dry NEUROLOGIC:  Alert and oriented x 3 PSYCHIATRIC:  Normal affect   EKG:  EKG is not ordered today.   Diagnoses:    1. CHB (complete heart block) ([place])   2. Longstanding persistent atrial fibrillation ([place])   3. NSVT (nonsustained ventricular tachycardia) ([place])   4. Essential hypertension   5. Pacemaker   6. Atherosclerosis of aorta ([place])   7. Long term current use of anticoagulant    Assessment and Plan:    CHB s/p PPM implant: Pacemaker dependent. Seen by Dr. Sallyanne Kuster on 11/22/2020 with device interrogation. Device switch to VVIR mode at that office visit. Patient has no concerns.  Continue remote downloads every 3 months.  She is due to see him in  6 months, appointment scheduled for her today.  Longstanding persistent atrial fibrillation/Rare NSVT on chronic anticoagulation: She is unaware of persistent atrial fibrillation.  Asymptomatic today. She reports her request for Xarelto refill was denied and she has 1 pill left. No bleeding concerns.  We have provided her with 2 boxes of Xarelto samples.  She is on appropriate dose at 15 mg daily.  Hypertension: BP is well controlled today.  Aortic and coronary atherosclerosis: Incidentally noted on CT. Labs done by PCP 09/2020, LDL 104. No indication to start a statin at her advanced age.      Disposition: 6 months with Dr. Sallyanne Kuster   Medication Adjustments/Labs and Tests Ordered: Current medicines are reviewed at length with the patient today.  Concerns regarding medicines are outlined above.  No orders of the defined types were placed in this encounter.  Meds ordered this encounter  Medications   Rivaroxaban (XARELTO) 15 MG TABS tablet    Sig: Take 1 tablet (15 mg total) by mouth daily with supper.    Dispense:  30 tablet    Refill:  11    Patient Instructions  Medication Instructions:   Your physician recommends that you continue on your current medications as  directed. Please refer to the Current Medication list given to you today.   *If you need a refill on your cardiac medications before your next appointment, please call your pharmacy*   Lab Work:  None ordered.  If you have labs (blood work) drawn today and your tests are completely normal, you will receive your results only by: [place] (if you have MyChart) OR A paper copy in the mail If you have any lab test that is abnormal or we need to change your treatment, we will call you to review the results.   Testing/Procedures:   None ordered.    Follow-Up: At [facility], you and your health needs are our priority.  As part of our continuing mission to provide you with exceptional heart care, we have created designated Provider Care Teams.  These Care Teams include your primary Cardiologist (physician) and Advanced Practice Providers (APPs -  Physician Assistants and Nurse Practitioners) who all work together to provide you with the care you need, when you need it.  We recommend signing up for the patient portal called "MyChart".  Sign up information is provided on this After Visit Summary.  MyChart is used to connect with patients for Virtual Visits (Telemedicine).  Patients are able to view lab/test results, encounter notes, upcoming appointments, etc.  Non-urgent messages can be sent to your provider as well.   To learn more about what you can do with MyChart, go to [email or web address].    Your next appointment:   6 month(s)  The format for your next appointment:   In Person  Provider:   Dr. Vonna Kotyk  If primary card or EP is not listed click here to update    :1}    Other Instructions   Important Information About Sugar         Signed, Emmaline Life, NP  07/06/2021 12:51 PM    [place]

## 2021-07-06 ENCOUNTER — Ambulatory Visit: Payer: Medicare Other | Admitting: Nurse Practitioner

## 2021-07-06 ENCOUNTER — Encounter: Payer: Self-pay | Admitting: Nurse Practitioner

## 2021-07-06 ENCOUNTER — Other Ambulatory Visit: Payer: Self-pay | Admitting: *Deleted

## 2021-07-06 VITALS — BP 138/70 | HR 82 | Ht 61.0 in | Wt 133.0 lb

## 2021-07-06 DIAGNOSIS — I10 Essential (primary) hypertension: Secondary | ICD-10-CM

## 2021-07-06 DIAGNOSIS — Z7901 Long term (current) use of anticoagulants: Secondary | ICD-10-CM

## 2021-07-06 DIAGNOSIS — I700 Atherosclerosis of aorta: Secondary | ICD-10-CM

## 2021-07-06 DIAGNOSIS — I4811 Longstanding persistent atrial fibrillation: Secondary | ICD-10-CM

## 2021-07-06 DIAGNOSIS — Z950 Presence of cardiac pacemaker: Secondary | ICD-10-CM

## 2021-07-06 DIAGNOSIS — I4729 Other ventricular tachycardia: Secondary | ICD-10-CM | POA: Diagnosis not present

## 2021-07-06 DIAGNOSIS — I442 Atrioventricular block, complete: Secondary | ICD-10-CM

## 2021-07-06 MED ORDER — RIVAROXABAN 15 MG PO TABS: 15.0000 mg | ORAL_TABLET | Freq: Every day | ORAL | 11 refills | Status: DC

## 2021-07-06 NOTE — Patient Instructions (Signed)
Medication Instructions:   Your physician recommends that you continue on your current medications as directed. Please refer to the Current Medication list given to you today.   *If you need a refill on your cardiac medications before your next appointment, please call your pharmacy*   Lab Work:  None ordered.  If you have labs (blood work) drawn today and your tests are completely normal, you will receive your results only by: [place] (if you have MyChart) OR A paper copy in the mail If you have any lab test that is abnormal or we need to change your treatment, we will call you to review the results.   Testing/Procedures:   None ordered.    Follow-Up: At [facility], you and your health needs are our priority.  As part of our continuing mission to provide you with exceptional heart care, we have created designated Provider Care Teams.  These Care Teams include your primary Cardiologist (physician) and Advanced Practice Providers (APPs -  Physician Assistants and Nurse Practitioners) who all work together to provide you with the care you need, when you need it.  We recommend signing up for the patient portal called "MyChart".  Sign up information is provided on this After Visit Summary.  MyChart is used to connect with patients for Virtual Visits (Telemedicine).  Patients are able to view lab/test results, encounter notes, upcoming appointments, etc.  Non-urgent messages can be sent to your provider as well.   To learn more about what you can do with MyChart, go to [email or web address].    Your next appointment:   6 month(s)  The format for your next appointment:   In Person  Provider:   Dr. Vonna Kotyk  If primary card or EP is not listed click here to update    :1}    Other Instructions   Important Information About Sugar

## 2021-07-14 ENCOUNTER — Ambulatory Visit (INDEPENDENT_AMBULATORY_CARE_PROVIDER_SITE_OTHER): Payer: Medicare Other

## 2021-07-14 DIAGNOSIS — I442 Atrioventricular block, complete: Secondary | ICD-10-CM | POA: Diagnosis not present

## 2021-07-14 LAB — CUP PACEART REMOTE DEVICE CHECK
Battery Impedance: 1243 Ohm
Battery Remaining Longevity: 65 mo
Battery Voltage: 2.76 V
Brady Statistic RV Percent Paced: 100 %
Date Time Interrogation Session: 20230608150802
Implantable Lead Implant Date: 20100511
Implantable Lead Implant Date: 20100511
Implantable Lead Location: 753859
Implantable Lead Location: 753860
Implantable Lead Model: 4469
Implantable Lead Model: 4470
Implantable Lead Serial Number: 525535
Implantable Lead Serial Number: 640302
Implantable Pulse Generator Implant Date: 20140801
Lead Channel Impedance Value: 624 Ohm
Lead Channel Impedance Value: 67 Ohm
Lead Channel Pacing Threshold Amplitude: 0.625 V
Lead Channel Pacing Threshold Pulse Width: 0.4 ms
Lead Channel Setting Pacing Amplitude: 2 V
Lead Channel Setting Pacing Pulse Width: 0.4 ms
Lead Channel Setting Sensing Sensitivity: 2 mV

## 2021-07-22 NOTE — Progress Notes (Signed)
Remote pacemaker transmission.   

## 2021-09-14 DIAGNOSIS — R7989 Other specified abnormal findings of blood chemistry: Secondary | ICD-10-CM | POA: Diagnosis not present

## 2021-09-14 DIAGNOSIS — E785 Hyperlipidemia, unspecified: Secondary | ICD-10-CM | POA: Diagnosis not present

## 2021-09-14 DIAGNOSIS — I10 Essential (primary) hypertension: Secondary | ICD-10-CM | POA: Diagnosis not present

## 2021-09-26 DIAGNOSIS — E785 Hyperlipidemia, unspecified: Secondary | ICD-10-CM | POA: Diagnosis not present

## 2021-09-26 DIAGNOSIS — J449 Chronic obstructive pulmonary disease, unspecified: Secondary | ICD-10-CM | POA: Diagnosis not present

## 2021-09-26 DIAGNOSIS — I480 Paroxysmal atrial fibrillation: Secondary | ICD-10-CM | POA: Diagnosis not present

## 2021-09-26 DIAGNOSIS — I10 Essential (primary) hypertension: Secondary | ICD-10-CM | POA: Diagnosis not present

## 2021-09-26 DIAGNOSIS — Z0000 Encounter for general adult medical examination without abnormal findings: Secondary | ICD-10-CM | POA: Diagnosis not present

## 2021-10-13 ENCOUNTER — Ambulatory Visit (INDEPENDENT_AMBULATORY_CARE_PROVIDER_SITE_OTHER): Payer: Medicare Other

## 2021-10-13 DIAGNOSIS — I442 Atrioventricular block, complete: Secondary | ICD-10-CM | POA: Diagnosis not present

## 2021-10-18 LAB — CUP PACEART REMOTE DEVICE CHECK
Battery Impedance: 1326 Ohm
Battery Remaining Longevity: 62 mo
Battery Voltage: 2.76 V
Brady Statistic RV Percent Paced: 100 %
Date Time Interrogation Session: 20230907140008
Implantable Lead Implant Date: 20100511
Implantable Lead Implant Date: 20100511
Implantable Lead Location: 753859
Implantable Lead Location: 753860
Implantable Lead Model: 4469
Implantable Lead Model: 4470
Implantable Lead Serial Number: 525535
Implantable Lead Serial Number: 640302
Implantable Pulse Generator Implant Date: 20140801
Lead Channel Impedance Value: 590 Ohm
Lead Channel Impedance Value: 67 Ohm
Lead Channel Pacing Threshold Amplitude: 0.5 V
Lead Channel Pacing Threshold Pulse Width: 0.4 ms
Lead Channel Setting Pacing Amplitude: 2 V
Lead Channel Setting Pacing Pulse Width: 0.4 ms
Lead Channel Setting Sensing Sensitivity: 2 mV

## 2021-10-29 NOTE — Progress Notes (Signed)
Remote pacemaker transmission.   

## 2021-11-15 ENCOUNTER — Other Ambulatory Visit: Payer: Self-pay | Admitting: Cardiovascular Disease

## 2021-11-16 DIAGNOSIS — H401132 Primary open-angle glaucoma, bilateral, moderate stage: Secondary | ICD-10-CM | POA: Diagnosis not present

## 2021-11-16 DIAGNOSIS — H25812 Combined forms of age-related cataract, left eye: Secondary | ICD-10-CM | POA: Diagnosis not present

## 2022-01-04 DIAGNOSIS — J449 Chronic obstructive pulmonary disease, unspecified: Secondary | ICD-10-CM | POA: Diagnosis not present

## 2022-01-04 DIAGNOSIS — Z7901 Long term (current) use of anticoagulants: Secondary | ICD-10-CM | POA: Diagnosis not present

## 2022-01-04 DIAGNOSIS — I129 Hypertensive chronic kidney disease with stage 1 through stage 4 chronic kidney disease, or unspecified chronic kidney disease: Secondary | ICD-10-CM | POA: Diagnosis not present

## 2022-01-04 DIAGNOSIS — I480 Paroxysmal atrial fibrillation: Secondary | ICD-10-CM | POA: Diagnosis not present

## 2022-01-12 ENCOUNTER — Ambulatory Visit: Payer: Medicare Other | Attending: Cardiovascular Disease | Admitting: Cardiovascular Disease

## 2022-01-12 ENCOUNTER — Ambulatory Visit: Payer: Medicare Other

## 2022-01-12 ENCOUNTER — Encounter: Payer: Self-pay | Admitting: Cardiovascular Disease

## 2022-01-12 VITALS — BP 140/84 | HR 83 | Ht 62.0 in | Wt 135.8 lb

## 2022-01-12 DIAGNOSIS — I4821 Permanent atrial fibrillation: Secondary | ICD-10-CM | POA: Diagnosis not present

## 2022-01-12 DIAGNOSIS — I10 Essential (primary) hypertension: Secondary | ICD-10-CM

## 2022-01-12 DIAGNOSIS — I4729 Other ventricular tachycardia: Secondary | ICD-10-CM

## 2022-01-12 DIAGNOSIS — Z950 Presence of cardiac pacemaker: Secondary | ICD-10-CM

## 2022-01-12 DIAGNOSIS — I2510 Atherosclerotic heart disease of native coronary artery without angina pectoris: Secondary | ICD-10-CM

## 2022-01-12 DIAGNOSIS — I2584 Coronary atherosclerosis due to calcified coronary lesion: Secondary | ICD-10-CM

## 2022-01-12 DIAGNOSIS — I442 Atrioventricular block, complete: Secondary | ICD-10-CM

## 2022-01-12 DIAGNOSIS — I700 Atherosclerosis of aorta: Secondary | ICD-10-CM

## 2022-01-12 DIAGNOSIS — R55 Syncope and collapse: Secondary | ICD-10-CM

## 2022-01-12 NOTE — Patient Instructions (Signed)
Medication Instructions:  No Changes In Medications at this time.  *If you need a refill on your cardiac medications before your next appointment, please call your pharmacy*  Lab Work: None Ordered At This Time.  If you have labs (blood work) drawn today and your tests are completely normal, you will receive your results only by: MyChart Message (if you have MyChart) OR A paper copy in the mail If you have any lab test that is abnormal or we need to change your treatment, we will call you to review the results.  Testing/Procedures: None Ordered At This Time.   Follow-Up: At [facility], you and your health needs are our priority.  As part of our continuing mission to provide you with exceptional heart care, we have created designated Provider Care Teams.  These Care Teams include your primary Cardiologist (physician) and Advanced Practice Providers (APPs -  Physician Assistants and Nurse Practitioners) who all work together to provide you with the care you need, when you need it.  Your next appointment:   1 year(s)  The format for your next appointment:   In Person  Provider:   Mihai Croitoru, MD           

## 2022-01-12 NOTE — Progress Notes (Signed)
Cardiology Office Note    Date:  01/13/2022   ID:  Sanders, Toni [birth year], MRN [id number]  PCP:  Burnard Bunting, MD  Cardiologist:   Sanda Klein, MD   Chief Complaint  Patient presents with   Pacemaker Check    History of Present Illness:  Toni Sanders is a 88 y.o. female with complete heart block, dual-chamber permanent pacemaker, systemic hypertension, history of paroxysmal atrial flutter and atrial fibrillation and history of rare nonsustained VT, now with permanent atrial fibrillation.  Her dual-chamber permanent pacemaker implanted in 2014 is now programmed VVIR for permanent atrial fibrillation.  Estimated generator longevity is 5 years.  The ventricular lead (Medtronic [id number] implanted in 2010) has normal parameters.  She has 100% ventricular pacing and does not have any underlying idioventricular escape rhythm.  She is fully pacemaker dependent.  The heart rate histogram distribution is excellent.  There have been no episodes of high ventricular rates (in the past she had occasional nonsustained VT including a lengthy 22 beat episode in November 2021).  Doing quite well and continues to live independently with support from her daughter who helps her with groceries and housekeeping.  Limitations are primarily related to hip and knee pain and she walks cautiously with an assist device.  She denies angina or dyspnea with activity and has not had dizziness, palpitations, syncope or falls.  She is compliant with anticoagulation with Xarelto and has not had any serious bleeding problems.  Denies edema, orthopnea, PND or chest pain.  She has not had any focal neurological events.  Is a little more hard of hearing.  Past Medical History:  Diagnosis Date   Atrial flutter ([place]) 2010   Bradycardia 2010   Complete heart block ([place]) 2010   s/p PPM by Dr Doreatha Lew (MDT) 06/16/08   H/O cardiovascular stress test 09/22/10   no ischemia, EF >60%   LVH (left ventricular hypertrophy)  09/22/10   Echo >55% mild LVH    Osteoporosis     Past Surgical History:  Procedure Laterality Date   CHOLECYSTECTOMY  1994   PACEMAKER GENERATOR CHANGE  09/06/12   new medtronic generator Adapta L secondary to early battery depletion   PACEMAKER GENERATOR CHANGE N/A 09/06/2012   Procedure: PACEMAKER GENERATOR CHANGE;  Surgeon: Sanda Klein, MD;  Location: [facility];  Service: Cardiovascular;  Laterality: N/A;   PACEMAKER PLACEMENT  06/16/08   Implantation of dual-chamber Medtronic PPMY by Dr Doreatha Lew    Current Medications: Outpatient Medications Prior to Visit  Medication Sig Dispense Refill   acetaminophen (TYLENOL) 325 MG tablet Take 650 mg by mouth every 6 (six) hours as needed. (Patient not taking: Reported on 01/12/2022)     fish oil-omega-3 fatty acids 1000 MG capsule Take 1 g by mouth daily. 1 tab po qd     metoprolol succinate (TOPROL-XL) 50 MG 24 hr tablet TAKE ONE AND ONE-HALF TABLETS DAILY 135 tablet 2   Rivaroxaban (XARELTO) 15 MG TABS tablet Take 1 tablet (15 mg total) by mouth daily with supper. 30 tablet 11   No facility-administered medications prior to visit.     Allergies:   Anesthetics, amide; Anesthetics, ester; Anesthetics, halogenated; and Morphine and related   Social History   Socioeconomic History   Marital status: Married    Spouse name: Not on file   Number of children: Not on file   Years of education: Not on file   Highest education level: Not on file  Occupational History  Not on file  Tobacco Use   Smoking status: Never   Smokeless tobacco: Never  Substance and Sexual Activity   Alcohol use: No   Drug use: No   Sexual activity: Not on file  Other Topics Concern   Not on file  Social History Narrative   Lives with spouse in [place].   Social Determinants of Health   Financial Resource Strain: Not on file  Food Insecurity: Not on file  Transportation Needs: Not on file  Physical Activity: Not on file  Stress: Not on file  Social  Connections: Not on file     Family History:  The patient's family history includes Cancer - Ovarian in her maternal grandmother; Cancer - Prostate in her father; Hypertension in her unknown relative; Pneumonia in her paternal grandmother.   ROS:   Please see the history of present illness.    ROS All other systems are reviewed and are negative.   PHYSICAL EXAM:   VS:  BP (!) 140/84 (BP Location: Left Arm, Patient Position: Sitting, Cuff Size: Normal)   Pulse 83   Ht '5\' 2"'$  (1.575 m)   Wt 61.6 kg   SpO2 96%   BMI 24.84 kg/m      General: Alert, oriented x3, no distress, healthy left subclavian pacemaker site Head: no evidence of trauma, PERRL, EOMI, no exophtalmos or lid lag, no myxedema, no xanthelasma; normal ears, nose and oropharynx Neck: normal jugular venous pulsations and no hepatojugular reflux; brisk carotid pulses without delay and no carotid bruits Chest: clear to auscultation, no signs of consolidation by percussion or palpation, normal fremitus, symmetrical and full respiratory excursions Cardiovascular: normal position and quality of the apical impulse, regular rhythm, normal first and paradoxically split second heart sounds, no murmurs, rubs or gallops Abdomen: no tenderness or distention, no masses by palpation, no abnormal pulsatility or arterial bruits, normal bowel sounds, no hepatosplenomegaly Extremities: no clubbing, cyanosis or edema; 2+ radial, ulnar and brachial pulses bilaterally; 2+ right femoral, posterior tibial and dorsalis pedis pulses; 2+ left femoral, posterior tibial and dorsalis pedis pulses; no subclavian or femoral bruits Neurological: grossly nonfocal Psych: Normal mood and affect    Wt Readings from Last 3 Encounters:  01/12/22 61.6 kg  07/06/21 60.3 kg  11/22/20 59.2 kg      Studies/Labs Reviewed:   EKG: Ordered today, shows 100% background atrial fibrillation and ventricular pacing.  The QRS duration is 162 ms with expected prolongation  of the QTc at 505 ms ASSESSMENT:    1. CHB (complete heart block) (HCC)   2. Pacemaker   3. Permanent atrial fibrillation ([place])   4. NSVT (nonsustained ventricular tachycardia) ([place])   5. Essential hypertension   6. Vasovagal syncope   7. Atherosclerosis of aorta ([place])   8. Coronary artery calcification      PLAN:  In order of problems listed above:  CHB: Pacemaker dependent.  There is no escape rhythm when pacing is taken down to 35 bpm. PPM: Dual-chamber device is programmed VVIR for permanent atrial fibrillation with normal function.  Continue remote downloads every 3 months.  AFib: Permanent arrhythmia.  Completely asymptomatic and rate control is not an issue since she has heart block. Appropriately anticoagulated CHADSVasc 4 (age 79, gender, HTN). NSVT: None seen recently.  Asymptomatic.  The episodes are very infrequent once or twice every 2 or 3 years, but can be lengthy, including a 22 beat episode that occurred last November.  On beta-blockers. HTN: Acceptable control. Vasovagal syncope: Lifelong complaint,  but very infrequent.  She has not had the episode of true syncope in over 3 years.  She is aware of the prodrome and is able to abort full-blown syncope by laying down. Xarelto: Without bleeding or falls. Aortic and coronary atherosclerosis: Asymptomatic.  Incidentally noted on CT. In the absence of symptoms, the focus is on risk factor control.  Hard to justify initiating statin in the absence of problems by age 31. R hip pain: CT showed severe R hip arthropathy and thoracolumbar spondylosis with multiple areas of foraminal impingement.    Medication Adjustments/Labs and Tests Ordered: Current medicines are reviewed at length with the patient today.  Concerns regarding medicines are outlined above.  Medication changes, Labs and Tests ordered today are listed in the Patient Instructions below. Patient Instructions  Medication Instructions:  No Changes In Medications at  this time.  *If you need a refill on your cardiac medications before your next appointment, please call your pharmacy*  Lab Work: None Ordered At This Time.  If you have labs (blood work) drawn today and your tests are completely normal, you will receive your results only by: [place] (if you have MyChart) OR A paper copy in the mail If you have any lab test that is abnormal or we need to change your treatment, we will call you to review the results.  Testing/Procedures: None Ordered At This Time.   Follow-Up: At [facility], you and your health needs are our priority.  As part of our continuing mission to provide you with exceptional heart care, we have created designated Provider Care Teams.  These Care Teams include your primary Cardiologist (physician) and Advanced Practice Providers (APPs -  Physician Assistants and Nurse Practitioners) who all work together to provide you with the care you need, when you need it.  Your next appointment:   1 year(s)  The format for your next appointment:   In Person  Provider:   Sanda Klein, MD       Signed, Sanda Klein, MD  01/13/2022 9:43 AM    [address], [place], [place]  [address] Phone: [phone]; Fax: [phone]

## 2022-01-13 ENCOUNTER — Encounter: Payer: Self-pay | Admitting: Cardiovascular Disease

## 2022-04-13 ENCOUNTER — Ambulatory Visit: Payer: Medicare Other

## 2022-04-13 DIAGNOSIS — I442 Atrioventricular block, complete: Secondary | ICD-10-CM | POA: Diagnosis not present

## 2022-04-14 LAB — CUP PACEART REMOTE DEVICE CHECK
Battery Impedance: 1550 Ohm
Battery Remaining Longevity: 55 mo
Battery Voltage: 2.76 V
Brady Statistic RV Percent Paced: 100 %
Date Time Interrogation Session: 20240307134835
Implantable Lead Connection Status: 753985
Implantable Lead Connection Status: 753985
Implantable Lead Implant Date: 20100511
Implantable Lead Implant Date: 20100511
Implantable Lead Location: 753859
Implantable Lead Location: 753860
Implantable Lead Model: 4469
Implantable Lead Model: 4470
Implantable Lead Serial Number: 525535
Implantable Lead Serial Number: 640302
Implantable Pulse Generator Implant Date: 20140801
Lead Channel Impedance Value: 582 Ohm
Lead Channel Impedance Value: 67 Ohm
Lead Channel Pacing Threshold Amplitude: 0.625 V
Lead Channel Pacing Threshold Pulse Width: 0.4 ms
Lead Channel Setting Pacing Amplitude: 2 V
Lead Channel Setting Pacing Pulse Width: 0.4 ms
Lead Channel Setting Sensing Sensitivity: 2 mV
Zone Setting Status: 755011
Zone Setting Status: 755011

## 2022-05-16 NOTE — Progress Notes (Signed)
Remote pacemaker transmission.   

## 2022-07-12 ENCOUNTER — Other Ambulatory Visit: Payer: Self-pay | Admitting: Nurse Practitioner

## 2022-07-12 DIAGNOSIS — I4811 Longstanding persistent atrial fibrillation: Secondary | ICD-10-CM

## 2022-07-12 NOTE — Telephone Encounter (Signed)
Prescription refill request for Xarelto received.  Indication: AF Last office visit: 01/12/22  Judie Petit Croitoru MD Weight: 61.6kg Age: 89 Scr: 0.8 on 09/04/19 CrCl: 39.09   Based on above findings Xarelto 15mg  daily is the appropriate dose.  Refill approved. Requested labs be done at next OV with Dr C.

## 2022-07-13 ENCOUNTER — Ambulatory Visit (INDEPENDENT_AMBULATORY_CARE_PROVIDER_SITE_OTHER): Payer: Medicare Other

## 2022-07-13 DIAGNOSIS — I442 Atrioventricular block, complete: Secondary | ICD-10-CM

## 2022-07-14 ENCOUNTER — Emergency Department (HOSPITAL_COMMUNITY)
Admission: EM | Admit: 2022-07-14 | Discharge: 2022-07-14 | Disposition: A | Discharge status: Home / Self Care | Payer: Medicare Other | Attending: Emergency Medicine | Admitting: Emergency Medicine

## 2022-07-14 ENCOUNTER — Encounter (HOSPITAL_COMMUNITY): Payer: Self-pay

## 2022-07-14 ENCOUNTER — Emergency Department (HOSPITAL_COMMUNITY): Payer: Medicare Other

## 2022-07-14 ENCOUNTER — Other Ambulatory Visit: Payer: Self-pay

## 2022-07-14 DIAGNOSIS — M25511 Pain in right shoulder: Secondary | ICD-10-CM | POA: Diagnosis not present

## 2022-07-14 DIAGNOSIS — Z7901 Long term (current) use of anticoagulants: Secondary | ICD-10-CM | POA: Insufficient documentation

## 2022-07-14 DIAGNOSIS — S40011A Contusion of right shoulder, initial encounter: Secondary | ICD-10-CM | POA: Diagnosis not present

## 2022-07-14 DIAGNOSIS — Z950 Presence of cardiac pacemaker: Secondary | ICD-10-CM | POA: Diagnosis not present

## 2022-07-14 DIAGNOSIS — I10 Essential (primary) hypertension: Secondary | ICD-10-CM | POA: Diagnosis not present

## 2022-07-14 DIAGNOSIS — I4891 Unspecified atrial fibrillation: Secondary | ICD-10-CM | POA: Insufficient documentation

## 2022-07-14 DIAGNOSIS — M542 Cervicalgia: Secondary | ICD-10-CM | POA: Diagnosis not present

## 2022-07-14 DIAGNOSIS — X58XXXA Exposure to other specified factors, initial encounter: Secondary | ICD-10-CM | POA: Diagnosis not present

## 2022-07-14 DIAGNOSIS — M79621 Pain in right upper arm: Secondary | ICD-10-CM | POA: Diagnosis not present

## 2022-07-14 DIAGNOSIS — Z79899 Other long term (current) drug therapy: Secondary | ICD-10-CM | POA: Diagnosis not present

## 2022-07-14 DIAGNOSIS — R079 Chest pain, unspecified: Secondary | ICD-10-CM | POA: Diagnosis not present

## 2022-07-14 LAB — PROTIME-INR
INR: 2.3 — ABNORMAL HIGH (ref 0.8–1.2)
Prothrombin Time: 25.2 seconds — ABNORMAL HIGH (ref 11.4–15.2)

## 2022-07-14 LAB — CBC
HCT: 38.5 % (ref 36.0–46.0)
Hemoglobin: 12.1 g/dL (ref 12.0–15.0)
MCH: 31.7 pg (ref 26.0–34.0)
MCHC: 31.4 g/dL (ref 30.0–36.0)
MCV: 100.8 fL — ABNORMAL HIGH (ref 80.0–100.0)
Platelets: 153 10*3/uL (ref 150–400)
RBC: 3.82 MIL/uL — ABNORMAL LOW (ref 3.87–5.11)
RDW: 14.9 % (ref 11.5–15.5)
WBC: 8.7 10*3/uL (ref 4.0–10.5)
nRBC: 0 % (ref 0.0–0.2)

## 2022-07-14 LAB — BASIC METABOLIC PANEL
Anion gap: 10 (ref 5–15)
BUN: 14 mg/dL (ref 8–23)
CO2: 19 mmol/L — ABNORMAL LOW (ref 22–32)
Calcium: 8.3 mg/dL — ABNORMAL LOW (ref 8.9–10.3)
Chloride: 106 mmol/L (ref 98–111)
Creatinine, Ser: 0.83 mg/dL (ref 0.44–1.00)
GFR, Estimated: >60 mL/min (ref >60)
Glucose, Bld: 128 mg/dL — ABNORMAL HIGH (ref 70–99)
Potassium: 4.1 mmol/L (ref 3.5–5.1)
Sodium: 135 mmol/L (ref 135–145)

## 2022-07-14 LAB — CUP PACEART REMOTE DEVICE CHECK
Battery Impedance: 1606 Ohm
Battery Remaining Longevity: 54 mo
Battery Voltage: 2.75 V
Brady Statistic RV Percent Paced: 100 %
Date Time Interrogation Session: 20240606151030
Implantable Lead Connection Status: 753985
Implantable Lead Connection Status: 753985
Implantable Lead Implant Date: 20100511
Implantable Lead Implant Date: 20100511
Implantable Lead Location: 753859
Implantable Lead Location: 753860
Implantable Lead Model: 4469
Implantable Lead Model: 4470
Implantable Lead Serial Number: 525535
Implantable Lead Serial Number: 640302
Implantable Pulse Generator Implant Date: 20140801
Lead Channel Impedance Value: 658 Ohm
Lead Channel Impedance Value: 67 Ohm
Lead Channel Pacing Threshold Amplitude: 0.5 V
Lead Channel Pacing Threshold Pulse Width: 0.4 ms
Lead Channel Setting Pacing Amplitude: 2 V
Lead Channel Setting Pacing Pulse Width: 0.4 ms
Lead Channel Setting Sensing Sensitivity: 2 mV
Zone Setting Status: 755011
Zone Setting Status: 755011

## 2022-07-14 MED ORDER — ONDANSETRON HCL 4 MG/2ML IJ SOLN
4.0000 mg | Freq: Once | INTRAMUSCULAR | Status: AC
  Administered 2022-07-14: 4 mg via INTRAVENOUS
  Filled 2022-07-14: qty 2

## 2022-07-14 MED ORDER — ONDANSETRON 4 MG PO TBDP
4.0000 mg | ORAL_TABLET | Freq: Once | ORAL | Status: AC
  Administered 2022-07-14: 4 mg via ORAL
  Filled 2022-07-14: qty 1

## 2022-07-14 MED ORDER — ONDANSETRON HCL 4 MG PO TABS: 4.0000 mg | ORAL_TABLET | Freq: Three times a day (TID) | ORAL | 0 refills | Status: AC | PRN

## 2022-07-14 MED ORDER — OXYCODONE-ACETAMINOPHEN 5-325 MG PO TABS
1.0000 | ORAL_TABLET | Freq: Once | ORAL | Status: AC
  Administered 2022-07-14: 1 via ORAL
  Filled 2022-07-14: qty 1

## 2022-07-14 NOTE — ED Provider Notes (Signed)
[place] EMERGENCY DEPARTMENT AT [facility] Provider Note   CSN: [id number] Arrival date & time: 07/14/22  0740     History  Chief Complaint  Patient presents with   Shoulder Pain    Toni Sanders is a 89 y.o. female with a history of pacemaker secondary to complete heart block, atrial flutter, hypertension, and long term anticoagulant use who presents to the ED today via EMS for right arm pain. Patient lives at home alone and her daughter came to visit her and was the one who called EMS after patient was not able to sleep last night because of her pain. Patient reports that about 3 months ago she noticed bruising to her shoulder that extends to her chest and down her arm. She is not sure what she did to cause the bruising to occur, but denies any falls. She is on  Xarelto for atrial flutter. 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    Home Medications Prior to Admission medications   Medication Sig Start Date End Date Taking? Authorizing Provider  acetaminophen (TYLENOL) 325 MG tablet Take 650 mg by mouth every 6 (six) hours as needed. Patient not taking: Reported on 01/12/2022    [provider]  fish oil-omega-3 fatty acids 1000 MG capsule Take 1 g by mouth daily. 1 tab po qd    [provider]  metoprolol succinate (TOPROL-XL) 50 MG 24 hr tablet TAKE ONE AND ONE-HALF TABLETS DAILY 11/15/21   Croitoru, Mihai, MD  Rivaroxaban (XARELTO) 15 MG TABS tablet TAKE ONE TABLET BY MOUTH DAILY with SUPPER 07/12/22   Croitoru, Rachelle Hora, MD      Allergies    Anesthetics, amide; Anesthetics, ester; Anesthetics, halogenated; Morphine and codeine; and Percocet [oxycodone-acetaminophen]    Review of Systems   Review of Systems  Musculoskeletal:        Right shoulder pain  All other systems reviewed and are negative.   Physical Exam Updated Vital Signs BP (!) 163/78 (BP Location: Right Arm)    Pulse 71   Temp 98.5 F (36.9 C) (Oral)   Resp 17   Ht 5\' 2"  (1.575 m)   Wt 61 kg   SpO2 100%   BMI 24.60 kg/m  Physical Exam Vitals and nursing note reviewed.  Constitutional:      Appearance: Normal appearance.  HENT:     Head: Normocephalic and atraumatic.     Mouth/Throat:     Mouth: Mucous membranes are moist.  Eyes:     Conjunctiva/sclera: Conjunctivae normal.     Pupils: Pupils are equal, round, and reactive to light.  Cardiovascular:     Rate and Rhythm: Normal rate and regular rhythm.     Pulses: Normal pulses.  Pulmonary:     Effort: Pulmonary effort is normal.     Breath sounds: Normal breath sounds.  Abdominal:     Palpations: Abdomen is soft.     Tenderness: There is no abdominal tenderness.  Musculoskeletal:     Comments: Tenderness to right shoulder  Skin:    General: Skin is warm and dry.     Findings: Bruising present. No rash.     Comments: Purple/yellow colored right shoulder bruising that extends to right chest  Neurological:     General: No focal deficit present.     Mental Status: She is alert.  Psychiatric:        Mood and Affect: Mood  normal.        Behavior: Behavior normal.     ED Results / Procedures / Treatments   Labs (all labs ordered are listed, but only abnormal results are displayed) Labs Reviewed  PROTIME-INR - Abnormal; Notable for the following components:      Result Value   Prothrombin Time 25.2 (*)    INR 2.3 (*)    All other components within normal limits  CBC - Abnormal; Notable for the following components:   RBC 3.82 (*)    MCV 100.8 (*)    All other components within normal limits  BASIC METABOLIC PANEL - Abnormal; Notable for the following components:   CO2 19 (*)    Glucose, Bld 128 (*)    Calcium 8.3 (*)    All other components within normal limits    EKG EKG Interpretation  Date/Time:  Friday July 14 2022 07:50:39 EDT Ventricular Rate:  64 PR Interval:  33 QRS Duration: 167 QT Interval:  488 QTC  Calculation: 504 R Axis:   -69 Text Interpretation: Sinus rhythm Short PR interval Left bundle branch block Confirmed by Eber Hong ([id number]) on 07/14/2022 8:15:35 AM  Radiology DG Chest 2 View  Result Date: 07/14/2022 CLINICAL DATA:  Right shoulder injury, right upper chest pain and chest bruising near pacemaker site. EXAM: CHEST - 2 VIEW COMPARISON:  06/17/2008 FINDINGS: Stable appearance of dual-chamber pacemaker without lead disruption or other obvious abnormality. The heart size is stable and within normal limits. Stable mild elevation of the right hemidiaphragm. There is no evidence of pulmonary edema, consolidation, pneumothorax, nodule or pleural fluid. Bony structures demonstrate osteopenia of the spine with very mild loss of height some lower thoracic vertebral bodies since 2010. IMPRESSION: No acute findings. Stable appearance of dual-chamber pacemaker. Osteopenia of the spine with very mild loss of height of some lower thoracic vertebral bodies since 2010. Electronically Signed   By: Irish Lack M.D.   On: 07/14/2022 09:18   DG Humerus Right  Result Date: 07/14/2022 CLINICAL DATA:  Two-week history of right shoulder pain and bruising. No known injury. EXAM: RIGHT HUMERUS - 2 VIEW COMPARISON:  None Available. FINDINGS: Partially imaged right chest wall pacemaker. There is no evidence of fracture or other focal bone lesions. Soft tissues are unremarkable. IMPRESSION: No focal radiographic abnormality of the right humerus. Electronically Signed   By: Agustin Cree M.D.   On: 07/14/2022 09:15   CUP PACEART REMOTE DEVICE CHECK  Result Date: 07/14/2022 Scheduled remote reviewed. Normal device function.  1 NSVT, 9 beats in duration Permanent AF, Xarelto per PA report Next remote 91 days. LA, CVRS  DG Shoulder Right  Result Date: 07/14/2022 CLINICAL DATA:  Right shoulder pain and bruising. EXAM: RIGHT SHOULDER - 2+ VIEW COMPARISON:  None Available. FINDINGS: No acute fracture or dislocation. Bones  are osteopenic. Mild degenerative disease of the [facility] joint and acromial spur formation. There is soft tissue prominence in the deltoid region. This is nonspecific. Correlation suggested with any focal tenderness, palpable abnormality or evidence of hematoma. IMPRESSION: 1. No acute fracture or dislocation. Mild degenerative disease of the [facility] joint and acromial spur formation. 2. Nonspecific soft tissue prominence in the deltoid region. Correlation suggested with any focal tenderness, palpable abnormality or evidence of hematoma. Electronically Signed   By: Irish Lack M.D.   On: 07/14/2022 09:13    Procedures Procedures: not indicated.   Medications Ordered in ED Medications  ondansetron (ZOFRAN) injection 4 mg (has no administration in time  range)  oxyCODONE-acetaminophen (PERCOCET/ROXICET) 5-325 MG per tablet 1 tablet (1 tablet Oral Given 07/14/22 0900)  ondansetron (ZOFRAN-ODT) disintegrating tablet 4 mg (4 mg Oral Given 07/14/22 0900)    ED Course/ Medical Decision Making/ A&P                             Medical Decision Making Amount and/or Complexity of Data Reviewed Radiology: ordered.  Risk Prescription drug management.   This patient presents to the ED for concern of right shoulder pain, this involves an extensive number of treatment options, and is a complaint that carries with it a high risk of complications and morbidity.   Differential diagnosis includes: ACS, fracture, dislocation, contusion, septic arthritis, arthritis.   Co morbidities that complicate the patient evaluation  Pacemaker secondary to complete heart block Atrial fibrillation Hypertension Xarelto use   Additional history obtained:  Additional history obtained from patient's daughters at bedside.   Cardiac Monitoring / EKG:  The patient was maintained on a cardiac monitor.  I personally viewed and interpreted the cardiac monitored which showed: atrial flutter with LBBB with a heart rate of 64  bpm.   Lab Tests:  I ordered and personally interpreted labs.  The pertinent results include:  CBC within normal limits.   Imaging Studies ordered:  I ordered imaging studies including x-rays of the right shoulder, humerus, and chest I independently visualized and interpreted imaging which showed no fracture or dislocation. Stable appearance of dual-chamber pacemaker . I agree with the radiologist interpretation.    Problem List / ED Course / Critical interventions / Medication management  Right shoulder pain - without fracture, dislocation, or bone lesions noted on imaging. I ordered medications including: Percocet and Zofran for pain  Percocet made patient feel more nauseous, so an additional 4 mg of Zofran was ordered. Reevaluation of the patient after these medicines showed that the patient improved Daughter told me that patient's respiration rate dropped to zero for several seconds when she fell asleep and was concerned. No history of OSA. Respirations varied between 16-26 when I was in the room and was in no acute distress. SpO2 was stable at 98%. I advised patient to follow up with PCP for further evaluation. I have reviewed the patients home medicines and have made adjustments as needed   Social Determinants of Health:  Housing Social connection   Test / Admission - Considered:  Patient is stable and safe for discharge home. Instructed to take Tylenol for pain and ice the shoulder at home since Percocet made her nauseous.  Advised to follow up with PCP if concerned about the sporadic decrease in respirations with sleeping.        Final Clinical Impression(s) / ED Diagnoses Final diagnoses:  Contusion of right shoulder, initial encounter    Rx / DC Orders ED Discharge Orders     None         Maxwell Marion, PA-C 07/14/22 1041    Eber Hong, MD 07/15/22 [phone]

## 2022-07-14 NOTE — ED Triage Notes (Signed)
Pt to the ed form home via ems with right shoulder pain x 2 week with bruising. Pain increased this morning that lead her to call EMS. Pt has pace maker on the right side near bruising site. Pt lives at home independently. A&Ox4 PMS in all extremities.

## 2022-07-14 NOTE — Discharge Instructions (Addendum)
No fractures, dislocations, or bone lesions on imaging. Be careful with daily activities that can lead to bruising due to Xarelto usage.  You can use Tylenol as need for pain and ice the shoulder as well.  Return to ED if: Your hematoma is in your chest or belly and you: Pass out. Feel weak. Become short of breath. You have a hematoma on your scalp that is caused by a fall or injury, and you: Have a headache that gets worse. Have trouble speaking or understanding speech. Become less alert or you pass out.

## 2022-07-19 ENCOUNTER — Telehealth: Payer: Self-pay

## 2022-07-19 NOTE — Telephone Encounter (Signed)
Transition Care Management Follow-up Telephone Call Date of discharge and from where: Toni [facility] 6/7 How have you been since you were released from the hospital? Doing fine  Any questions or concerns? No  Items Reviewed: Did the pt receive and understand the discharge instructions provided? Yes  Medications obtained and verified? No  Other? No  Any new allergies since your discharge? No  Dietary orders reviewed? No Do you have support at home? Yes    Follow up appointments reviewed:  PCP Hospital f/u appt confirmed? No  Scheduled to see  on  @ . Specialist Hospital f/u appt confirmed? No  Scheduled to see  on  @ . Are transportation arrangements needed? No  If their condition worsens, is the pt aware to call PCP or go to the Emergency Dept.? Yes Was the patient provided with contact information for the PCP's office or ED? Yes Was to pt encouraged to call back with questions or concerns? Yes

## 2022-07-26 DIAGNOSIS — I480 Paroxysmal atrial fibrillation: Secondary | ICD-10-CM | POA: Diagnosis not present

## 2022-08-04 NOTE — Progress Notes (Signed)
Remote pacemaker transmission.   

## 2022-08-15 ENCOUNTER — Other Ambulatory Visit: Payer: Self-pay | Admitting: Cardiovascular Disease

## 2022-09-04 DIAGNOSIS — I129 Hypertensive chronic kidney disease with stage 1 through stage 4 chronic kidney disease, or unspecified chronic kidney disease: Secondary | ICD-10-CM | POA: Diagnosis not present

## 2022-09-04 DIAGNOSIS — I480 Paroxysmal atrial fibrillation: Secondary | ICD-10-CM | POA: Diagnosis not present

## 2022-09-25 DIAGNOSIS — E785 Hyperlipidemia, unspecified: Secondary | ICD-10-CM | POA: Diagnosis not present

## 2022-09-25 DIAGNOSIS — I129 Hypertensive chronic kidney disease with stage 1 through stage 4 chronic kidney disease, or unspecified chronic kidney disease: Secondary | ICD-10-CM | POA: Diagnosis not present

## 2022-09-26 DIAGNOSIS — C7A1 Malignant poorly differentiated neuroendocrine tumors: Secondary | ICD-10-CM | POA: Diagnosis not present

## 2022-09-26 DIAGNOSIS — D485 Neoplasm of uncertain behavior of skin: Secondary | ICD-10-CM | POA: Diagnosis not present

## 2022-10-02 DIAGNOSIS — Z1339 Encounter for screening examination for other mental health and behavioral disorders: Secondary | ICD-10-CM | POA: Diagnosis not present

## 2022-10-02 DIAGNOSIS — Z1331 Encounter for screening for depression: Secondary | ICD-10-CM | POA: Diagnosis not present

## 2022-10-02 DIAGNOSIS — R82998 Other abnormal findings in urine: Secondary | ICD-10-CM | POA: Diagnosis not present

## 2022-10-02 DIAGNOSIS — Z0000 Encounter for general adult medical examination without abnormal findings: Secondary | ICD-10-CM | POA: Diagnosis not present

## 2022-10-02 DIAGNOSIS — J449 Chronic obstructive pulmonary disease, unspecified: Secondary | ICD-10-CM | POA: Diagnosis not present

## 2022-10-04 ENCOUNTER — Other Ambulatory Visit: Payer: Self-pay | Admitting: *Deleted

## 2022-10-04 DIAGNOSIS — N63 Unspecified lump in unspecified breast: Secondary | ICD-10-CM

## 2022-10-04 NOTE — Progress Notes (Signed)
PATIENT NAVIGATOR PROGRESS NOTE  Name: GIZZELLE CREDIT Date: 10/04/2022 MRN: [id number]  DOB: Aug 10, 1922   Reason for visit:  New patient appt  Comments:  Called and spoke with Erskine Squibb, Ms Ansah' daughter and scheduled pt with Dr Truett Perna on 10/17/22 at 1:40 pm for New pt appt  Reviewed directions to building and parking as well as contact number to call with any questions.  Called [facility] and Dr Juel Burrow has ordered breast prognostics profile on pathology    Time spent counseling/coordinating care: 30-45 minutes

## 2022-10-15 DIAGNOSIS — R059 Cough, unspecified: Secondary | ICD-10-CM | POA: Diagnosis not present

## 2022-10-15 DIAGNOSIS — R0602 Shortness of breath: Secondary | ICD-10-CM | POA: Diagnosis not present

## 2022-10-15 DIAGNOSIS — J811 Chronic pulmonary edema: Secondary | ICD-10-CM | POA: Diagnosis not present

## 2022-10-15 DIAGNOSIS — R0989 Other specified symptoms and signs involving the circulatory and respiratory systems: Secondary | ICD-10-CM | POA: Diagnosis not present

## 2022-10-15 DIAGNOSIS — J90 Pleural effusion, not elsewhere classified: Secondary | ICD-10-CM | POA: Diagnosis not present

## 2022-10-15 DIAGNOSIS — Z1152 Encounter for screening for COVID-19: Secondary | ICD-10-CM | POA: Diagnosis not present

## 2022-10-15 DIAGNOSIS — I517 Cardiomegaly: Secondary | ICD-10-CM | POA: Diagnosis not present

## 2022-10-15 DIAGNOSIS — Z79899 Other long term (current) drug therapy: Secondary | ICD-10-CM | POA: Diagnosis not present

## 2022-10-17 ENCOUNTER — Inpatient Hospital Stay: Payer: Medicare Other | Attending: Oncology | Admitting: Oncology

## 2022-10-17 ENCOUNTER — Other Ambulatory Visit: Payer: Self-pay | Admitting: *Deleted

## 2022-10-17 ENCOUNTER — Encounter: Payer: Self-pay | Admitting: *Deleted

## 2022-10-17 ENCOUNTER — Ambulatory Visit (HOSPITAL_BASED_OUTPATIENT_CLINIC_OR_DEPARTMENT_OTHER)
Admission: RE | Admit: 2022-10-17 | Discharge: 2022-10-17 | Disposition: A | Discharge status: Home / Self Care | Payer: Medicare Other | Source: Ambulatory Visit | Attending: Oncology | Admitting: Oncology

## 2022-10-17 VITALS — BP 142/65 | HR 88 | Temp 98.2°F | Resp 18 | Ht 62.0 in | Wt 124.8 lb

## 2022-10-17 DIAGNOSIS — C50912 Malignant neoplasm of unspecified site of left female breast: Secondary | ICD-10-CM | POA: Diagnosis not present

## 2022-10-17 DIAGNOSIS — Z171 Estrogen receptor negative status [ER-]: Secondary | ICD-10-CM | POA: Diagnosis not present

## 2022-10-17 DIAGNOSIS — J90 Pleural effusion, not elsewhere classified: Secondary | ICD-10-CM | POA: Insufficient documentation

## 2022-10-17 DIAGNOSIS — Z8041 Family history of malignant neoplasm of ovary: Secondary | ICD-10-CM | POA: Insufficient documentation

## 2022-10-17 DIAGNOSIS — N6320 Unspecified lump in the left breast, unspecified quadrant: Secondary | ICD-10-CM | POA: Diagnosis not present

## 2022-10-17 DIAGNOSIS — J811 Chronic pulmonary edema: Secondary | ICD-10-CM | POA: Diagnosis not present

## 2022-10-17 DIAGNOSIS — Z803 Family history of malignant neoplasm of breast: Secondary | ICD-10-CM | POA: Insufficient documentation

## 2022-10-17 DIAGNOSIS — N63 Unspecified lump in unspecified breast: Secondary | ICD-10-CM

## 2022-10-17 DIAGNOSIS — C7951 Secondary malignant neoplasm of bone: Secondary | ICD-10-CM | POA: Insufficient documentation

## 2022-10-17 DIAGNOSIS — I2510 Atherosclerotic heart disease of native coronary artery without angina pectoris: Secondary | ICD-10-CM | POA: Insufficient documentation

## 2022-10-17 DIAGNOSIS — R590 Localized enlarged lymph nodes: Secondary | ICD-10-CM | POA: Insufficient documentation

## 2022-10-17 DIAGNOSIS — I517 Cardiomegaly: Secondary | ICD-10-CM | POA: Insufficient documentation

## 2022-10-17 DIAGNOSIS — Z9049 Acquired absence of other specified parts of digestive tract: Secondary | ICD-10-CM | POA: Insufficient documentation

## 2022-10-17 DIAGNOSIS — R918 Other nonspecific abnormal finding of lung field: Secondary | ICD-10-CM | POA: Diagnosis not present

## 2022-10-17 DIAGNOSIS — I700 Atherosclerosis of aorta: Secondary | ICD-10-CM | POA: Diagnosis not present

## 2022-10-17 DIAGNOSIS — Z9851 Tubal ligation status: Secondary | ICD-10-CM

## 2022-10-17 DIAGNOSIS — Z86718 Personal history of other venous thrombosis and embolism: Secondary | ICD-10-CM

## 2022-10-17 DIAGNOSIS — Z950 Presence of cardiac pacemaker: Secondary | ICD-10-CM | POA: Insufficient documentation

## 2022-10-17 DIAGNOSIS — Z800 Family history of malignant neoplasm of digestive organs: Secondary | ICD-10-CM | POA: Insufficient documentation

## 2022-10-17 NOTE — Progress Notes (Signed)
PATIENT NAVIGATOR PROGRESS NOTE  Name: Toni Sanders Date: 10/17/2022 MRN: [id number]  DOB: 12/09/1922   Reason for visit:  New Patient appt  Comments:  Met with Toni Sanders and her daughters Toni Sanders and Toni Sanders to discuss new breast cancer diagnosis  Ordered stat CT Chest for staging Dr Truett Perna will speak with surgeons for possible consult for surgery Reviewed pathology with daughters and patient Will return to clinic on 10/19/22 for treatment discussion Given contact information to call with any issues or questions    Time spent counseling/coordinating care: > 60 minutes

## 2022-10-17 NOTE — Progress Notes (Signed)
[facility] New Patient Consult   Requesting MD: Arminda Resides, Md [address] [place],  [place] [address]   Toni Sanders 89 y.o.  28-Oct-1922    Reason for Consult: Breast mass   HPI: Toni Sanders noted a skin abnormality at the inferior left breast beginning in March of this year.  She reports being treated with antibiotics via her primary provider.  She was referred to Dr. Yetta Barre on 09/26/2022.  He noted a 3 cm firm indurated ulcerated erythematous plaque at the left chest/inferior aspect of the left breast.  A biopsy was obtained..  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  No connection between the dermal tumor and overlying epidermis.  The tumor is positive for CK7 and estrogen receptor.  A breast origin was favored.  A prognostic profile found the tumor to be negative for estrogen and progesterone receptors.  The HER2 returned equivocal at 2+ by immunohistochemical stain and negative by FISH.  Toni Sanders is here with her 2 daughters.  She reports the cough present when she was seen in the emergency room 10/15/2022 has improved after she was given Lasix.  No other complaint.  Past Medical History:  Diagnosis Date   Atrial flutter (HCC) 2010   Bradycardia 2010   Complete heart block (HCC) 2010   s/p PPM by Dr Deborah Chalk ([facility]) 06/16/08   H/O cardiovascular stress test 09/22/10   no ischemia, EF >60%   LVH (left ventricular hypertrophy) 09/22/10   Echo >55% mild LVH    Osteoporosis     .  Multiple basal cell carcinomas   .  G4, P3, 1 miscarriage   .  CAD  Past Surgical History:  Procedure Laterality Date   CHOLECYSTECTOMY  1994   PACEMAKER GENERATOR CHANGE  09/06/12   new medtronic generator Adapta L secondary to early battery depletion   PACEMAKER GENERATOR CHANGE N/A 09/06/2012   Procedure: PACEMAKER GENERATOR CHANGE;  Surgeon: Thurmon Fair, MD;  Location: [facility] CATH LAB;  Service: Cardiovascular;  Laterality: N/A;    PACEMAKER PLACEMENT  06/16/08   Implantation of dual-chamber Medtronic PPMY by Dr Deborah Chalk    Medications: Reviewed  Allergies:  Allergies  Allergen Reactions   Anesthetics, Amide     "hard to come out of"   Anesthetics, Ester Nausea Only   Anesthetics, Halogenated Nausea Only   Morphine And Codeine Nausea And Vomiting   Percocet [Oxycodone-Acetaminophen] Nausea Only    Family history: A daughter died of breast cancer at age 76.  Her maternal grandmother had ovarian cancer.  Her brother had colon cancer.  Social History:   She lives alone in [place].  She plans to relocate to [place] to live with her daughter.  She is a Futures trader.  She has not used cigarettes or alcohol.  No transfusion history.  No risk factor for hepatitis  ROS:   Positives include: Left breast skin lesion noted March 2024, dyspnea/cough for 2 to 3 months (seen in the emergency room 10/15/2022 and found to have a "pleural effusion "), right arm and shoulder discomfort May 2024-improved  A complete ROS was otherwise negative.  Physical Exam:  Blood pressure (!) 142/65, pulse 88, temperature 98.2 F (36.8 C), temperature source Oral, resp. rate 18, height 5\' 2"  (1.575 m), weight 124 lb 12.8 oz (56.6 kg), SpO2 96%.  HEENT: Oropharynx without visible mass, neck without mass Lungs: Coarse rhonchi at the lower posterior chest bilaterally, no respiratory distress Cardiac: Regular rate and rhythm  Abdomen: No hepatosplenomegaly Breast: Firm 3 cm mass with ulceration of the skin at the outermost 6 o'clock position of the inferior left breast, the mass is mobile from the chest wall, right breast without mass Vascular: No leg edema Lymph nodes: 1-1.5 cm firm mobile left axillary node.  No other cervical, supraclavicular, axillary, or inguinal nodes Neurologic: Alert and oriented, the motor exam appears intact in the upper lower extremities bilaterally Skin: Multiple benign-appearing maculopapular lesions  over the trunk and extremities Musculoskeletal: No spine tenderness   LAB:  CBC  Lab Results  Component Value Date   WBC 8.7 07/14/2022   HGB 12.1 07/14/2022   HCT 38.5 07/14/2022   MCV 100.8 (H) 07/14/2022   PLT 153 07/14/2022   NEUTROABS 3.6 06/13/2008        CMP  Lab Results  Component Value Date   NA 135 07/14/2022   K 4.1 07/14/2022   CL 106 07/14/2022   CO2 19 (L) 07/14/2022   GLUCOSE 128 (H) 07/14/2022   BUN 14 07/14/2022   CREATININE 0.83 07/14/2022   CALCIUM 8.3 (L) 07/14/2022   GFRNONAA >60 07/14/2022   GFRAA 62 (L) 09/06/2012       Assessment/Plan:   Left breast mass, palpable left axillary lymph node-clinical presentation consistent with breast cancer Punch biopsy of left chest mass 09/26/2022-poorly differentiated adenocarcinoma, CK7 positive, TTF-1, GATA3, and ER negative, tumor infiltrating the dermis with extension to the subcutis and no connection to the overlying epidermis.,  ER 0%, PR 0%, Ki-67 50%, HER2 2+, HER2 negative by FISH  History of atrial flutter and heart block, pacemaker G4, P3, 1 miscarriage Family history of breast, colon, and ovarian cancer CAD 6.   History of a "leg clot " 7.   Cholecystectomy 8.   "Pleural effusion "noted   Disposition:   Toni Sanders is referred for evaluation of poorly differentiated adenocarcinoma involving biopsy of a left chest wall mass.  She appears to have a mass in the inferior left breast with extension to the skin and ulceration.  There is a palpable node in the left axilla.  I discussed treatment options with Toni Sanders and her family.  The tumor is triple negative.  We discussed surgery with a lumpectomy or simple mastectomy if there is no radiologic evidence of metastatic disease.  There is a palpable node in the left axilla.  We also discussed palliative radiation and systemic chemotherapy options.  She will be referred for a staging chest CT and return for an office visit on 10/19/2022.  I will  contact the surgical service to discuss management options.  We will refer her for genetics testing/counseling given her personal and family history of breast and ovarian cancer.  Thornton Papas, MD  10/17/2022, 4:08 PM

## 2022-10-18 ENCOUNTER — Other Ambulatory Visit: Payer: Self-pay | Admitting: *Deleted

## 2022-10-18 ENCOUNTER — Inpatient Hospital Stay: Payer: Medicare Other | Admitting: Licensed Clinical Social Worker

## 2022-10-18 DIAGNOSIS — N63 Unspecified lump in unspecified breast: Secondary | ICD-10-CM

## 2022-10-18 NOTE — Progress Notes (Signed)
Referrals entered for SW and Genetics

## 2022-10-18 NOTE — Progress Notes (Signed)
[facility] CSW Progress Note  Clinical Child psychotherapist contacted caregiver by phone to assess needs.  CSW spoke briefly with patient's daughter, Erskine Squibb, who was on the road for business.  CSW introduced self and provided contact information for future needs.    Dorothey Baseman, LCSW Clinical Social Worker [facility]

## 2022-10-19 ENCOUNTER — Inpatient Hospital Stay: Payer: Medicare Other | Admitting: Oncology

## 2022-10-19 ENCOUNTER — Telehealth: Payer: Self-pay | Admitting: Emergency Medicine

## 2022-10-19 VITALS — BP 148/64 | HR 89 | Temp 98.2°F | Resp 18 | Wt 125.0 lb

## 2022-10-19 DIAGNOSIS — I2510 Atherosclerotic heart disease of native coronary artery without angina pectoris: Secondary | ICD-10-CM | POA: Diagnosis not present

## 2022-10-19 DIAGNOSIS — J90 Pleural effusion, not elsewhere classified: Secondary | ICD-10-CM | POA: Diagnosis not present

## 2022-10-19 DIAGNOSIS — N63 Unspecified lump in unspecified breast: Secondary | ICD-10-CM

## 2022-10-19 DIAGNOSIS — R918 Other nonspecific abnormal finding of lung field: Secondary | ICD-10-CM | POA: Diagnosis not present

## 2022-10-19 DIAGNOSIS — C50912 Malignant neoplasm of unspecified site of left female breast: Secondary | ICD-10-CM | POA: Diagnosis not present

## 2022-10-19 DIAGNOSIS — C7951 Secondary malignant neoplasm of bone: Secondary | ICD-10-CM | POA: Diagnosis not present

## 2022-10-19 DIAGNOSIS — Z171 Estrogen receptor negative status [ER-]: Secondary | ICD-10-CM | POA: Diagnosis not present

## 2022-10-19 DIAGNOSIS — Z8041 Family history of malignant neoplasm of ovary: Secondary | ICD-10-CM | POA: Diagnosis not present

## 2022-10-19 DIAGNOSIS — Z803 Family history of malignant neoplasm of breast: Secondary | ICD-10-CM | POA: Diagnosis not present

## 2022-10-19 DIAGNOSIS — Z950 Presence of cardiac pacemaker: Secondary | ICD-10-CM | POA: Diagnosis not present

## 2022-10-19 DIAGNOSIS — Z9049 Acquired absence of other specified parts of digestive tract: Secondary | ICD-10-CM | POA: Diagnosis not present

## 2022-10-19 DIAGNOSIS — Z800 Family history of malignant neoplasm of digestive organs: Secondary | ICD-10-CM | POA: Diagnosis not present

## 2022-10-19 NOTE — Telephone Encounter (Signed)
Pt's daughter, Erskine Squibb returned call [facility])- appt made for 10/26/22 at 10:40 with Dr Royann Shivers. She stated that the patient is having a PET on 10/27/22 and asked if it was ok to have that with a pacemaker. I asked Dr Royann Shivers and he said that it is ok to have PET if you have a pacemaker. She verbalized understanding.

## 2022-10-19 NOTE — Telephone Encounter (Signed)
Called patient- left message stating that we need to get her scheduled for an appointment. Left call back number.   There are openings on 10/26/22 if she is able to come that day?  Called Kemora Wilhoit [facility]) and she said she would call me back because she has someone on hold

## 2022-10-19 NOTE — Progress Notes (Signed)
[facility] OFFICE PROGRESS NOTE   Diagnosis: Breast cancer  INTERVAL HISTORY:   Ms. Krum returns as scheduled.  She is here with her daughter.  Dyspnea remains improved.  No change in the left breast mass.  No new complaint.  Objective:  Vital signs in last 24 hours:  There were no vitals taken for this visit.    Resp: Lungs clear bilaterally, no respiratory distress Cardio: Regular rate and rhythm Vascular: No leg edema Breast: Ulcerated mass at the inferior left breast.  No bleeding.   Lab Results:  Lab Results  Component Value Date   WBC 8.7 07/14/2022   HGB 12.1 07/14/2022   HCT 38.5 07/14/2022   MCV 100.8 (H) 07/14/2022   PLT 153 07/14/2022   NEUTROABS 3.6 06/13/2008    CMP  Lab Results  Component Value Date   NA 135 07/14/2022   K 4.1 07/14/2022   CL 106 07/14/2022   CO2 19 (L) 07/14/2022   GLUCOSE 128 (H) 07/14/2022   BUN 14 07/14/2022   CREATININE 0.83 07/14/2022   CALCIUM 8.3 (L) 07/14/2022   GFRNONAA >60 07/14/2022   GFRAA 62 (L) 09/06/2012    No results found for: "CEA1", "CEA", "ZOX096", "CA125"  Lab Results  Component Value Date   INR 2.3 (H) 07/14/2022   LABPROT 25.2 (H) 07/14/2022    Imaging:  CT Chest Wo Contrast  Result Date: 10/17/2022 CLINICAL DATA:  Breast cancer staging; * Tracking Code: BO * EXAM: CT CHEST WITHOUT CONTRAST TECHNIQUE: Multidetector CT imaging of the chest was performed following the standard protocol without IV contrast. RADIATION DOSE REDUCTION: This exam was performed according to the departmental dose-optimization program which includes automated exposure control, adjustment of the mA and/or kV according to patient size and/or use of iterative reconstruction technique. COMPARISON:  None Available. FINDINGS: Cardiovascular: Cardiomegaly. No pericardial effusion. Normal caliber thoracic aorta with mild calcified plaque. Severe coronary artery calcifications. Mediastinum/Nodes: Esophagus and thyroid  are unremarkable. Asymmetrically enlarged left axillary lymph nodes. Reference node measuring 10 mm in short axis on series 2 image 27. Enlarged mediastinal lymph nodes. Reference AP window lymph node measuring 1.7 cm in short axis on series 2, image 49 and reference subcarinal lymph node measuring 1.9 cm in short axis on image 69. Asymmetric fullness of the left hilum, lack of IV contrast limits evaluation. Lungs/Pleura: Central airways are patent. Scattered solid pulmonary nodules. Largest is a 6 mm nodule of the right upper lobe located on series 4, image 49. Small right-greater-than-left pleural effusions and atelectasis. Mild lower lung predominant septal thickening. Upper Abdomen: Prior cholecystectomy. Musculoskeletal: Left breast mass measuring 4.1 x 2.3 cm. Chronic appearing fractures of the anterior left third and fourth ribs, fourth rib has an irregular appearance. IMPRESSION: 1. Left breast mass, consistent with primary breast malignancy. 2. Asymmetrically enlarged left axillary and enlarged mediastinal lymph nodes, concerning for nodal metastatic disease. 3. Asymmetric fullness of the left hilum, possibly due to nodal disease, although lack of IV contrast limits evaluation. 4. Mild pulmonary edema and small right-greater-than-left pleural effusions. 5. Scattered solid pulmonary nodules, largest is a 6 mm nodule of the right upper lobe, concerning for pulmonary metastatic disease. 6. Chronic appearing fractures of the anterior left third and fourth ribs, fourth rib has an irregular appearance which is concerning for pathologic fracture. 7. Cardiomegaly with, coronary artery calcifications, and aortic Atherosclerosis (ICD10-I70.0). Electronically Signed   By: Allegra Lai M.D.   On: 10/17/2022 16:26    Medications: I have reviewed  the patient's current medications.   Assessment/Plan: Breast cancer  left breast mass, palpable left axillary lymph node-clinical presentation consistent with breast  cancer Punch biopsy of left chest mass 09/26/2022-poorly differentiated adenocarcinoma, CK7 positive, TTF-1, GATA3, and ER negative, tumor infiltrating the dermis with extension to the subcutis and no connection to the overlying epidermis.,  ER 0%, PR 0%, Ki-67 50%, HER2 2+, HER2 negative by FISH CT chest 10/17/2022-left breast mass, enlarged left axillary and mediastinal lymph nodes, asymmetric fullness of the left hilum, mild pulmonary edema and small right greater than left pleural effusions, scattered solid pulmonary nodules, largest 6 mm in the right upper lobe, chronic fractures of the anterior left third and fourth ribs-irregular appearance of left fourth rib concerning for a pathologic fracture  History of atrial flutter and heart block, pacemaker G4, P3, 1 miscarriage Family history of breast, colon, and ovarian cancer CAD 6.   History of a "leg clot " 7.   Cholecystectomy 8.   "Pleural effusion "noted    Disposition: Ms. Echeverry appears stable.  I discussed the CT findings and reviewed the images with Ms. Sermons and her daughter.  She appears to have metastatic breast cancer.  The plan was to consider left breast/axillary surgery if she had disease confined to the breast and axilla.  She will not be a surgical candidate if she has metastatic disease.  She agrees to a staging PET scan to confirm metastatic disease involving the lungs, mediastinal lymph nodes, and potentially the bones.  She will return for an office visit and further discussion after the staging PET scan.  We discussed potential systemic treatment options if she is confirmed to have metastatic disease.  These will include comfort care with palliative radiation to the left breast versus a trial of systemic chemotherapy (likely capecitabine)  Thornton Papas, MD  10/19/2022  1:47 PM

## 2022-10-19 NOTE — Telephone Encounter (Signed)
Secure chat from Dr Truett Perna to Dr Royann Shivers  She has been diagnosed with breast cancer, appears to have metastatic disease, on Xarelto placed on hold due to bleeding from an ulcerated left breast mass, she presented to the emergency room in [place] with dyspnea and a cough-improved with furosemide, she wanted me to let you know about her new diagnosis and discontinuation of Xarelto, thanks, Brad  1:53 PM MC Thurmon Fair, MD Thank you for the update. Sorry to hear about all her health challenges  22 mins GS Ladene Artist, MD Sorry to bother you, she and her daughters really to me to contact you, her dyspnea improved with Lasix she was given in the emergency room in [place], has small bilateral pleural effusions, BNP was mildly elevated  17 mins MC Mihai Croitoru, MD No bother at all. I'll try to see if I can get her into the office. Maybe the 19th, Florentina Addison (if she is back in town).  1  14 mins GS Ladene Artist, MD Thank

## 2022-10-26 ENCOUNTER — Encounter: Payer: Self-pay | Admitting: Obstetrics and Gynecology

## 2022-10-26 ENCOUNTER — Encounter: Payer: Self-pay | Admitting: Cardiovascular Disease

## 2022-10-26 ENCOUNTER — Telehealth: Payer: Self-pay | Admitting: Cardiovascular Disease

## 2022-10-26 ENCOUNTER — Ambulatory Visit: Payer: Medicare Other | Attending: Cardiovascular Disease | Admitting: Cardiovascular Disease

## 2022-10-26 VITALS — BP 138/80 | HR 80 | Ht 61.0 in | Wt 126.8 lb

## 2022-10-26 DIAGNOSIS — R0602 Shortness of breath: Secondary | ICD-10-CM

## 2022-10-26 DIAGNOSIS — I2584 Coronary atherosclerosis due to calcified coronary lesion: Secondary | ICD-10-CM

## 2022-10-26 DIAGNOSIS — I442 Atrioventricular block, complete: Secondary | ICD-10-CM

## 2022-10-26 DIAGNOSIS — I4729 Other ventricular tachycardia: Secondary | ICD-10-CM

## 2022-10-26 DIAGNOSIS — I509 Heart failure, unspecified: Secondary | ICD-10-CM | POA: Diagnosis not present

## 2022-10-26 DIAGNOSIS — I4821 Permanent atrial fibrillation: Secondary | ICD-10-CM

## 2022-10-26 DIAGNOSIS — Z950 Presence of cardiac pacemaker: Secondary | ICD-10-CM

## 2022-10-26 DIAGNOSIS — I2510 Atherosclerotic heart disease of native coronary artery without angina pectoris: Secondary | ICD-10-CM

## 2022-10-26 DIAGNOSIS — I700 Atherosclerosis of aorta: Secondary | ICD-10-CM

## 2022-10-26 DIAGNOSIS — I10 Essential (primary) hypertension: Secondary | ICD-10-CM

## 2022-10-26 MED ORDER — LASIX 20 MG PO TABS: 20.0000 mg | ORAL_TABLET | Freq: Every day | ORAL | 11 refills | Status: DC

## 2022-10-26 MED ORDER — FUROSEMIDE 20 MG PO TABS: 20.0000 mg | ORAL_TABLET | Freq: Every day | ORAL | 3 refills | Status: DC

## 2022-10-26 NOTE — Progress Notes (Signed)
Cardiology Office Note    Date:  10/26/2022   ID:  Toni Sanders, DOB Jun 19, 1922, MRN [id number]  PCP:  Geoffry Paradise, MD  Cardiologist:   Thurmon Fair, MD   Chief Complaint  Patient presents with   Shortness of Breath    History of Present Illness:  Toni Sanders is a 89 y.o. female with complete heart block, dual-chamber permanent pacemaker, systemic hypertension, history of paroxysmal atrial flutter and atrial fibrillation and history of rare nonsustained VT, now with permanent atrial fibrillation.  Her dual-chamber permanent pacemaker implanted in 2014 is now programmed VVIR for permanent atrial fibrillation.  Estimated generator longevity is 5 years.  The ventricular lead (Medtronic [id number] implanted in 2010) has normal parameters.  She has 100% ventricular pacing and does not have any underlying idioventricular escape rhythm.  She is fully pacemaker dependent.  The heart rate histogram distribution is excellent.  There have been no episodes of high ventricular rates (in the past she had occasional nonsustained VT including a lengthy 22 beat episode in November 2021).  She has had a couple of adverse events since last being seen in our clinic.  She has been diagnosed with left breast cancer and has had some bleeding from the related left breast mass.  She has seen Dr. Truett Perna in the oncology clinic and is due to have a PET scan tomorrow.  CT of the chest showed asymmetrically enlarged left axillary lymph nodes as well as enlarged mediastinal lymph nodes, concerning for metastatic malignancy.  She had small right greater than left pleural effusions.  Cardiomegaly, normal caliber aorta with "mild calcified plaque", "severe coronary artery calcifications"  She was visiting with her daughters in [place] when she developed what sounds like an episode of paroxysmal nocturnal dyspnea.  She had been breathing a little harder than usual for couple of days and then woke up around 2 or 3:00  in the morning with extreme dyspnea.  She went to the emergency room where she was told that she had a pleural effusion.  She improved quickly after she was administered diuretics.  Her shortness of breath has returned to baseline.  Never had any complaints of chest pain, palpitations, dizziness, syncope or lower extremity edema.  Xarelto has been stopped due to the left breast bleeding.  She has not had any severe bleeding problems from the medication in the past.  Past Medical History:  Diagnosis Date   Atrial flutter (HCC) 2010   Bradycardia 2010   Complete heart block (HCC) 2010   s/p PPM by Dr Deborah Chalk ([facility]) 06/16/08   H/O cardiovascular stress test 09/22/10   no ischemia, EF >60%   LVH (left ventricular hypertrophy) 09/22/10   Echo >55% mild LVH    Osteoporosis     Past Surgical History:  Procedure Laterality Date   CHOLECYSTECTOMY  1994   PACEMAKER GENERATOR CHANGE  09/06/12   new medtronic generator Adapta L secondary to early battery depletion   PACEMAKER GENERATOR CHANGE N/A 09/06/2012   Procedure: PACEMAKER GENERATOR CHANGE;  Surgeon: Thurmon Fair, MD;  Location: [facility] CATH [facility];  Service: Cardiovascular;  Laterality: N/A;   PACEMAKER PLACEMENT  06/16/08   Implantation of dual-chamber Medtronic PPMY by Dr Deborah Chalk    Current Medications: Outpatient Medications Prior to Visit  Medication Sig Dispense Refill   acetaminophen (TYLENOL) 325 MG tablet Take 650 mg by mouth every 6 (six) hours as needed.     fish oil-omega-3 fatty acids 1000 MG capsule Take 1 g by  mouth daily. 1 tab po qd     metoprolol succinate (TOPROL-XL) 50 MG 24 hr tablet TAKE ONE AND ONE-HALF TABLETS DAILY 135 tablet 2   Rivaroxaban (XARELTO) 15 MG TABS tablet TAKE ONE TABLET BY MOUTH DAILY with SUPPER 30 tablet 5   LASIX 20 MG tablet Take 20 mg by mouth daily. X 14 days     No facility-administered medications prior to visit.     Allergies:   Anesthetics, amide; Anesthetics, ester; Anesthetics, halogenated;  Morphine and codeine; and Percocet [oxycodone-acetaminophen]   Social History   Socioeconomic History   Marital status: Married    Spouse name: Not on file   Number of children: Not on file   Years of education: Not on file   Highest education level: Not on file  Occupational History   Not on file  Tobacco Use   Smoking status: Never   Smokeless tobacco: Never  Substance and Sexual Activity   Alcohol use: No   Drug use: No   Sexual activity: Not on file  Other Topics Concern   Not on file  Social History Narrative   Lives with spouse in [place].   Social Determinants of Health   Financial Resource Strain: Not on file  Food Insecurity: No Food Insecurity (10/17/2022)   Hunger Vital Sign    Worried About Running Out of Food in the Last Year: Never true    Ran Out of Food in the Last Year: Never true  Transportation Needs: No Transportation Needs (10/17/2022)   PRAPARE - Administrator, Civil Service (Medical): No    Lack of Transportation (Non-Medical): No  Physical Activity: Not on file  Stress: Not on file  Social Connections: Unknown (06/21/2021)   Received from [facility], [facility]   Social Network    Social Network: Not on file     Family History:  The patient's family history includes Cancer - Ovarian in her maternal grandmother; Cancer - Prostate in her father; Hypertension in her unknown relative; Pneumonia in her paternal grandmother.   ROS:   Please see the history of present illness.    ROS All other systems are reviewed and are negative.   PHYSICAL EXAM:   VS:  BP 138/80   Pulse 80   Ht 5\' 1"  (1.549 m)   Wt 126 lb 12.8 oz (57.5 kg)   SpO2 96%   BMI 23.96 kg/m      General: Alert, oriented x3, no distress, healthy left subclavian pacemaker site Head: no evidence of trauma, PERRL, EOMI, no exophtalmos or lid lag, no myxedema, no xanthelasma; normal ears, nose and oropharynx Neck: normal jugular venous pulsations and no  hepatojugular reflux; brisk carotid pulses without delay and no carotid bruits Chest: clear to auscultation, no signs of consolidation by percussion or palpation, normal fremitus, symmetrical and full respiratory excursions Cardiovascular: normal position and quality of the apical impulse, regular rhythm, normal first and paradoxically split second heart sounds, no murmurs, rubs or gallops Abdomen: no tenderness or distention, no masses by palpation, no abnormal pulsatility or arterial bruits, normal bowel sounds, no hepatosplenomegaly Extremities: no clubbing, cyanosis or edema; 2+ radial, ulnar and brachial pulses bilaterally; 2+ right femoral, posterior tibial and dorsalis pedis pulses; 2+ left femoral, posterior tibial and dorsalis pedis pulses; no subclavian or femoral bruits Neurological: grossly nonfocal Psych: Normal mood and affect    Wt Readings from Last 3 Encounters:  10/26/22 126 lb 12.8 oz (57.5 kg)  10/19/22 125 lb (56.7  kg)  10/17/22 124 lb 12.8 oz (56.6 kg)      Studies/Labs Reviewed:   EKG: Personally reviewed the tracing from 07/17/2022, shows 100% background atrial fibrillation and ventricular pacing.  The QRS duration is 146 ms with expected prolongation of the QTc at 447 ms ASSESSMENT:    1. Acute heart failure, unspecified heart failure type (HCC)   2. Shortness of breath   3. Coronary artery calcification   4. Atherosclerosis of aorta (HCC)   5. CHB (complete heart block) (HCC)   6. Pacemaker   7. Permanent atrial fibrillation (HCC)   8. NSVT (nonsustained ventricular tachycardia) (HCC)   9. Essential hypertension       PLAN:  In order of problems listed above:  CHF: Her episode of shortness of breath sounds consistent with paroxysmal nocturnal dyspnea.  She is much better now.  Currently she appears clinically euvolemic.  Reportedly had a pleural effusion on chest x-ray in [place].  She does not remember which side it was on.  The CT of her chest showed  small bilateral pleural effusions.  On physical exam today both sides sound very clear.  Rapid improvement with diuretics suggests that her pleural effusions were due to heart failure rather than malignancy.  Continue furosemide 20 mg daily.  At this dose she probably does not need a potassium supplement, but encouraged her to eat potassium rich foods.  Discussed sodium restricted diet, daily weight monitoring, signs and symptoms of heart failure exacerbation such as orthopnea and PND.  Cardiomegaly reported on CT could simply be due to biatrial enlargement in a patient with permanent atrial fibrillation.  She has not had an echocardiogram since 2012 and will check one.  The presence or the absence of normal left ventricular systolic function will dictate further treatment. Coronary and aortic atherosclerotic calcification: This is not surprising in this patient who is almost 89 years old and is difficult to quantify.  She does not have angina pectoris.  If LVEF is low, this could signal multivessel CAD.  How far we go with evaluation for this will depend on the prognosis of her breast cancer.  Difficult to justify starting a statin in a patient that has not had clinical vascular problems by age 66. CHB: Pacemaker dependent.  There is no escape rhythm when pacing is taken down to 35 bpm. PPM: Normally functioning device.  Dual-chamber device is programmed VVIR for permanent atrial fibrillation with normal function.  Continue remote downloads every 3 months.  AFib: Permanent arrhythmia.  Completely asymptomatic and rate control is not an issue since she has heart block.  CHADSVasc 4 (age 77, gender, HTN).  The anticoagulant is currently on hold NSVT: These episodes have been very infrequent and asymptomatic (usually no more than once a year), but can be lengthy, including a 22 beat episode that occurred in November 2022.  On beta-blockers primarily for this indication. HTN: Acceptable control. Vasovagal  syncope: Lifelong complaint, but very infrequent.  She has not had the episode of true syncope in over 4 years.  She is aware of the prodrome and is able to abort full-blown syncope by laying down.     Medication Adjustments/Labs and Tests Ordered: Current medicines are reviewed at length with the patient today.  Concerns regarding medicines are outlined above.  Medication changes, Labs and Tests ordered today are listed in the Patient Instructions below. Patient Instructions  Medication Instructions:  No changes *If you need a refill on your cardiac medications before your next  appointment, please call your pharmacy*  Daily weights- let us know if you gain 2-3 pounds overnight or 5 pounds in one week   Lab Work: BMP, BNP- when you come back to get your Echo If you have labs (blood work) drawn today and your tests are completely normal, you will receive your results only by: MyChart Message (if you have MyChart) OR A paper copy in the mail If you have any lab test that is abnormal or we need to change your treatment, we will call you to review the results.   Testing/Procedures: Your physician has requested that you have an echocardiogram. Echocardiography is a painless test that uses sound waves to create images of your heart. It provides your doctor with information about the size and shape of your heart and how well your heart's chambers and valves are working. This procedure takes approximately one hour. There are no restrictions for this procedure. Please do NOT wear cologne, perfume, aftershave, or lotions (deodorant is allowed). Please arrive 15 minutes prior to your appointment time.    Follow-Up: At [facility], you and your health needs are our priority.  As part of our continuing mission to provide you with exceptional heart care, we have created designated Provider Care Teams.  These Care Teams include your primary Cardiologist (physician) and Advanced Practice  Providers (APPs -  Physician Assistants and Nurse Practitioners) who all work together to provide you with the care you need, when you need it.  We recommend signing up for the patient portal called "MyChart".  Sign up information is provided on this After Visit Summary.  MyChart is used to connect with patients for Virtual Visits (Telemedicine).  Patients are able to view lab/test results, encounter notes, upcoming appointments, etc.  Non-urgent messages can be sent to your provider as well.   To learn more about what you can do with MyChart, go to [email or web address].    Your next appointment:   Need appointment with APP after Echocardiogram  Dr Royann Shivers 3-4 monthsCooking With Less Salt Cooking with less salt is one way to reduce the amount of salt (sodium) you get from food. Most people should have less than 2,300 milligrams (mg) of sodium each day. If you have high blood pressure (hypertension), you may need to limit your sodium to 1,500 mg each day. Follow the tips below to help reduce your sodium intake. What are tips for eating less sodium? Reading food labels  Check the food label before buying or using packaged ingredients. Always check the label for the serving size and sodium content. Choose products with less than 140 mg of sodium per serving. Check the % Daily Value column to see what percent of the daily recommended amount of sodium is in one serving of the product. Foods with 5% or less are low in sodium. Foods with 20% or more are high in sodium. Do not choose foods that have salt as one of the first three ingredients on the ingredients list. Always check how much sodium is in a product, even if the label says "unsalted" or "no salt added." Shopping Buy sodium-free or low-sodium products. Look for these words: Low-sodium. Sodium-free. Reduced-sodium. No salt added. Unsalted. Buy fresh or frozen foods without sauces or additives. Cooking Instead of salt, use herbs,  seasonings without salt, and spices. Use sodium-free baking soda. Grill, braise, or roast foods to add flavor with less salt. Do not add salt to pasta, rice, or hot cereals. Drain and rinse canned vegetables,  beans, and meat before use. Do not add salt when cooking sweets and desserts. Cook with low-sodium ingredients. Meal planning The sodium in bread can add up. Try to plan meals with other grains. These may include whole oats, quinoa, whole wheat pasta, and other whole grains that do not have sodium added to them. What foods are high in sodium? Vegetables Regular canned vegetables, except low-sodium or reduced-sodium items. Sauerkraut, pickled vegetables, and relishes. Olives. [place] fries. Onion rings. Regular canned tomato sauce and paste. Regular tomato and vegetable juice. Frozen vegetables in sauces. Grains Instant hot cereals. Bread stuffing, pancake, and biscuit mixes. Croutons. Seasoned rice or pasta mixes. Noodle soup cups. Boxed or frozen macaroni and cheese. Regular salted crackers. Self-rising flour. Rolls. Bagels. Flour tortillas and wraps. Meats and other proteins Meat or fish that is salted, canned, smoked, cured, spiced, or pickled. Precooked or cured meat, such as sausages or meat loaves. Tomasa Blase. Ham. Pepperoni. Hot dogs. Corned beef. Chipped beef. Salt pork. Jerky. Pickled herring, anchovies, and sardines. Regular canned tuna. Salted nuts. Dairy Processed cheese and cheese spreads. Hard cheeses. Cheese curds. Blue cheese. Feta cheese. String cheese. Regular cottage cheese. Buttermilk. Canned milk. The items listed above may not be a full list of foods high in sodium. Talk to a dietitian to learn more. What foods are low in sodium? Fruits Fresh, frozen, or canned fruit with no sauce added. Fruit juice. Vegetables Fresh or frozen vegetables with no sauce added. "No salt added" canned vegetables. "No salt added" tomato sauce and paste. Low-sodium or reduced-sodium tomato and  vegetable juice. Grains Noodles, pasta, quinoa, rice. Shredded or puffed wheat or puffed rice. Regular or quick oats (not instant). Low-sodium crackers. Low-sodium bread. Whole grain bread and whole grain pasta. Unsalted popcorn. Meats and other proteins Fresh or frozen whole meats, poultry that has not been injected with sodium, and fish with no sauce added. Unsalted nuts. Dried peas, beans, and lentils without added salt. Unsalted canned beans. Eggs. Unsalted nut butters. Low-sodium canned tuna or chicken. Dairy Milk. Soy milk. Yogurt. Low-sodium cheeses, such as Swiss, [address], [place], and [employer]. Sherbet or ice cream (keep to  cup per serving). Cream cheese. Fats and oils Unsalted butter or margarine. Other foods Homemade pudding. Sodium-free baking soda and baking powder. Herbs and spices. Low-sodium seasoning mixes. Beverages Coffee and tea. Carbonated beverages. The items listed above may not be a full list of foods low in sodium. Talk to a dietitian to learn more. What are some salt alternatives when cooking? Herbs, seasonings, and spices can be used instead of salt to flavor your food. Herbs should be fresh or dried. Do not choose packaged mixes. Next to the name of the herb, spice, or seasoning below are some foods you can pair it with. Herbs [place] leaves - Soups, meat and vegetable dishes, and spaghetti sauce. Basil - [employer], soups, pasta, and fish dishes. Cilantro - Meat, poultry, and vegetable dishes. Chili powder - Marinades and Mexican dishes. Chives - Salad dressings and potato dishes. Cumin - Mexican dishes, couscous, and meat dishes. Dill - Fish dishes, sauces, and salads. Fennel - Meat and vegetable dishes, breads, and cookies. Garlic (do not use garlic salt) - [place] dishes, meat dishes, salad dressings, and sauces. Marjoram - Soups, potato dishes, and meat dishes. Oregano - Pizza and spaghetti sauce. Parsley - Salads, soups, pasta, and meat  dishes. Rosemary - [place] dishes, salad dressings, soups, and red meats. Saffron - Fish dishes, pasta, and some poultry dishes. Sage -  Stuffings and sauces. Tarragon - Fish and [employer]. Thyme - Stuffing, meat, and fish dishes. Seasonings Lemon juice - Fish dishes, poultry dishes, vegetables, and salads. Vinegar - Salad dressings, vegetables, and fish dishes. Spices Cinnamon - Sweet dishes, such as cakes, cookies, and puddings. Cloves - Gingerbread, puddings, and marinades for meats. Curry - Vegetable dishes, fish and poultry dishes, and stir-fry dishes. Ginger - Vegetable dishes, fish dishes, and stir-fry dishes. Nutmeg - Pasta, vegetables, poultry, fish dishes, and custard. This information is not intended to replace advice given to you by your health care provider. Make sure you discuss any questions you have with your health care provider. Document Revised: 02/16/2022 Document Reviewed: 02/09/2022 Elsevier Patient Education  [address].     Signed, Thurmon Fair, MD  10/26/2022 3:28 PM    [facility] [address] [place], [place], [place]  [address] Phone: [phone]; Fax: [phone]

## 2022-10-26 NOTE — Telephone Encounter (Signed)
It should not say brand-name only.  Please send prescription for generic furosemide 20 mg once daily.

## 2022-10-26 NOTE — Telephone Encounter (Signed)
New Rx sent to pharmacy

## 2022-10-26 NOTE — Patient Instructions (Signed)
Medication Instructions:  No changes *If you need a refill on your cardiac medications before your next appointment, please call your pharmacy*  Daily weights- let us know if you gain 2-3 pounds overnight or 5 pounds in one week   Lab Work: BMP, BNP- when you come back to get your Echo If you have labs (blood work) drawn today and your tests are completely normal, you will receive your results only by: MyChart Message (if you have MyChart) OR A paper copy in the mail If you have any lab test that is abnormal or we need to change your treatment, we will call you to review the results.   Testing/Procedures: Your physician has requested that you have an echocardiogram. Echocardiography is a painless test that uses sound waves to create images of your heart. It provides your doctor with information about the size and shape of your heart and how well your heart's chambers and valves are working. This procedure takes approximately one hour. There are no restrictions for this procedure. Please do NOT wear cologne, perfume, aftershave, or lotions (deodorant is allowed). Please arrive 15 minutes prior to your appointment time.    Follow-Up: At [facility], you and your health needs are our priority.  As part of our continuing mission to provide you with exceptional heart care, we have created designated Provider Care Teams.  These Care Teams include your primary Cardiologist (physician) and Advanced Practice Providers (APPs -  Physician Assistants and Nurse Practitioners) who all work together to provide you with the care you need, when you need it.  We recommend signing up for the patient portal called "MyChart".  Sign up information is provided on this After Visit Summary.  MyChart is used to connect with patients for Virtual Visits (Telemedicine).  Patients are able to view lab/test results, encounter notes, upcoming appointments, etc.  Non-urgent messages can be sent to your provider as  well.   To learn more about what you can do with MyChart, go to [email or web address].    Your next appointment:   Need appointment with APP after Echocardiogram  Dr Royann Shivers 3-4 monthsCooking With Less Salt Cooking with less salt is one way to reduce the amount of salt (sodium) you get from food. Most people should have less than 2,300 milligrams (mg) of sodium each day. If you have high blood pressure (hypertension), you may need to limit your sodium to 1,500 mg each day. Follow the tips below to help reduce your sodium intake. What are tips for eating less sodium? Reading food labels  Check the food label before buying or using packaged ingredients. Always check the label for the serving size and sodium content. Choose products with less than 140 mg of sodium per serving. Check the % Daily Value column to see what percent of the daily recommended amount of sodium is in one serving of the product. Foods with 5% or less are low in sodium. Foods with 20% or more are high in sodium. Do not choose foods that have salt as one of the first three ingredients on the ingredients list. Always check how much sodium is in a product, even if the label says "unsalted" or "no salt added." Shopping Buy sodium-free or low-sodium products. Look for these words: Low-sodium. Sodium-free. Reduced-sodium. No salt added. Unsalted. Buy fresh or frozen foods without sauces or additives. Cooking Instead of salt, use herbs, seasonings without salt, and spices. Use sodium-free baking soda. Grill, braise, or roast foods to add flavor with less  salt. Do not add salt to pasta, rice, or hot cereals. Drain and rinse canned vegetables, beans, and meat before use. Do not add salt when cooking sweets and desserts. Cook with low-sodium ingredients. Meal planning The sodium in bread can add up. Try to plan meals with other grains. These may include whole oats, quinoa, whole wheat pasta, and other whole grains that do  not have sodium added to them. What foods are high in sodium? Vegetables Regular canned vegetables, except low-sodium or reduced-sodium items. Sauerkraut, pickled vegetables, and relishes. Olives. [place] fries. Onion rings. Regular canned tomato sauce and paste. Regular tomato and vegetable juice. Frozen vegetables in sauces. Grains Instant hot cereals. Bread stuffing, pancake, and biscuit mixes. Croutons. Seasoned rice or pasta mixes. Noodle soup cups. Boxed or frozen macaroni and cheese. Regular salted crackers. Self-rising flour. Rolls. Bagels. Flour tortillas and wraps. Meats and other proteins Meat or fish that is salted, canned, smoked, cured, spiced, or pickled. Precooked or cured meat, such as sausages or meat loaves. Tomasa Blase. Ham. Pepperoni. Hot dogs. Corned beef. Chipped beef. Salt pork. Jerky. Pickled herring, anchovies, and sardines. Regular canned tuna. Salted nuts. Dairy Processed cheese and cheese spreads. Hard cheeses. Cheese curds. Blue cheese. Feta cheese. String cheese. Regular cottage cheese. Buttermilk. Canned milk. The items listed above may not be a full list of foods high in sodium. Talk to a dietitian to learn more. What foods are low in sodium? Fruits Fresh, frozen, or canned fruit with no sauce added. Fruit juice. Vegetables Fresh or frozen vegetables with no sauce added. "No salt added" canned vegetables. "No salt added" tomato sauce and paste. Low-sodium or reduced-sodium tomato and vegetable juice. Grains Noodles, pasta, quinoa, rice. Shredded or puffed wheat or puffed rice. Regular or quick oats (not instant). Low-sodium crackers. Low-sodium bread. Whole grain bread and whole grain pasta. Unsalted popcorn. Meats and other proteins Fresh or frozen whole meats, poultry that has not been injected with sodium, and fish with no sauce added. Unsalted nuts. Dried peas, beans, and lentils without added salt. Unsalted canned beans. Eggs. Unsalted nut butters. Low-sodium canned  tuna or chicken. Dairy Milk. Soy milk. Yogurt. Low-sodium cheeses, such as Swiss, [address], [place], and [employer]. Sherbet or ice cream (keep to  cup per serving). Cream cheese. Fats and oils Unsalted butter or margarine. Other foods Homemade pudding. Sodium-free baking soda and baking powder. Herbs and spices. Low-sodium seasoning mixes. Beverages Coffee and tea. Carbonated beverages. The items listed above may not be a full list of foods low in sodium. Talk to a dietitian to learn more. What are some salt alternatives when cooking? Herbs, seasonings, and spices can be used instead of salt to flavor your food. Herbs should be fresh or dried. Do not choose packaged mixes. Next to the name of the herb, spice, or seasoning below are some foods you can pair it with. Herbs [place] leaves - Soups, meat and vegetable dishes, and spaghetti sauce. Basil - [employer], soups, pasta, and fish dishes. Cilantro - Meat, poultry, and vegetable dishes. Chili powder - Marinades and Mexican dishes. Chives - Salad dressings and potato dishes. Cumin - Mexican dishes, couscous, and meat dishes. Dill - Fish dishes, sauces, and salads. Fennel - Meat and vegetable dishes, breads, and cookies. Garlic (do not use garlic salt) - [place] dishes, meat dishes, salad dressings, and sauces. Marjoram - Soups, potato dishes, and meat dishes. Oregano - Pizza and spaghetti sauce. Parsley - Salads, soups, pasta, and meat dishes. Rosemary - [employer], salad  dressings, soups, and red meats. Saffron - Fish dishes, pasta, and some poultry dishes. Sage - Stuffings and sauces. Tarragon - Fish and [employer] Foods. Thyme - Stuffing, meat, and fish dishes. Seasonings Lemon juice - Fish dishes, poultry dishes, vegetables, and salads. Vinegar - Salad dressings, vegetables, and fish dishes. Spices Cinnamon - Sweet dishes, such as cakes, cookies, and puddings. Cloves - Gingerbread, puddings, and marinades for  meats. Curry - Vegetable dishes, fish and poultry dishes, and stir-fry dishes. Ginger - Vegetable dishes, fish dishes, and stir-fry dishes. Nutmeg - Pasta, vegetables, poultry, fish dishes, and custard. This information is not intended to replace advice given to you by your health care provider. Make sure you discuss any questions you have with your health care provider. Document Revised: 02/16/2022 Document Reviewed: 02/09/2022 Elsevier Patient Education  2024 [employer].

## 2022-10-26 NOTE — Telephone Encounter (Signed)
Spoke with pharmacy and they stated patient insurance does not cover name brand lasix. It will cost patient $75 for it.   They wanted to know if generic can be given? Rx states only fill name brand

## 2022-10-26 NOTE — Telephone Encounter (Signed)
Pt c/o medication issue:  1. Name of Medication: LASIX 20 MG tablet   2. How are you currently taking this medication (dosage and times per day)? N/A  3. Are you having a reaction (difficulty breathing--STAT)? No  4. What is your medication issue? Pharmacy is calling stating they do not carry Lasix only furosemide. She also reports the patient's insurance does not cover Lasix so it would be very expensive for her. She is wanting to confirmation on the prescription and if furosemide is okay she will need a new prescription sent.   Please advise.

## 2022-10-27 ENCOUNTER — Encounter: Payer: Self-pay | Admitting: Emergency Medicine

## 2022-10-27 ENCOUNTER — Telehealth: Payer: Self-pay | Admitting: Emergency Medicine

## 2022-10-27 ENCOUNTER — Other Ambulatory Visit (HOSPITAL_COMMUNITY): Payer: Self-pay

## 2022-10-27 ENCOUNTER — Encounter (HOSPITAL_COMMUNITY)
Admission: RE | Admit: 2022-10-27 | Discharge: 2022-10-27 | Disposition: A | Discharge status: Home / Self Care | Payer: Medicare Other | Source: Ambulatory Visit | Attending: Oncology | Admitting: Oncology

## 2022-10-27 DIAGNOSIS — N63 Unspecified lump in unspecified breast: Secondary | ICD-10-CM | POA: Diagnosis not present

## 2022-10-27 DIAGNOSIS — C7951 Secondary malignant neoplasm of bone: Secondary | ICD-10-CM | POA: Diagnosis not present

## 2022-10-27 DIAGNOSIS — C50912 Malignant neoplasm of unspecified site of left female breast: Secondary | ICD-10-CM | POA: Insufficient documentation

## 2022-10-27 DIAGNOSIS — I2510 Atherosclerotic heart disease of native coronary artery without angina pectoris: Secondary | ICD-10-CM | POA: Diagnosis not present

## 2022-10-27 DIAGNOSIS — I700 Atherosclerosis of aorta: Secondary | ICD-10-CM | POA: Insufficient documentation

## 2022-10-27 DIAGNOSIS — C778 Secondary and unspecified malignant neoplasm of lymph nodes of multiple regions: Secondary | ICD-10-CM | POA: Insufficient documentation

## 2022-10-27 DIAGNOSIS — C7981 Secondary malignant neoplasm of breast: Secondary | ICD-10-CM | POA: Diagnosis not present

## 2022-10-27 LAB — BASIC METABOLIC PANEL
BUN/Creatinine Ratio: 18 (ref 12–28)
BUN: 15 mg/dL (ref 10–36)
CO2: 22 mmol/L (ref 20–29)
Calcium: 9.6 mg/dL (ref 8.7–10.3)
Chloride: 104 mmol/L (ref 96–106)
Creatinine, Ser: 0.84 mg/dL (ref 0.57–1.00)
Glucose: 89 mg/dL (ref 70–99)
Potassium: 5 mmol/L (ref 3.5–5.2)
Sodium: 143 mmol/L (ref 134–144)
eGFR: 63 mL/min/{1.73_m2} (ref >59)

## 2022-10-27 LAB — BRAIN NATRIURETIC PEPTIDE: BNP: 168.2 pg/mL — ABNORMAL HIGH (ref 0.0–100.0)

## 2022-10-27 LAB — GLUCOSE, CAPILLARY: Glucose-Capillary: 95 mg/dL (ref 70–99)

## 2022-10-27 MED ORDER — FLUDEOXYGLUCOSE F - 18 (FDG) INJECTION
6.2500 | Freq: Once | INTRAVENOUS | Status: AC
  Administered 2022-10-27: 6.25 via INTRAVENOUS

## 2022-10-27 NOTE — Telephone Encounter (Signed)
Spoke with Elspeth Cho (daughter/EC)- the patient was in the background- she is very hard of hearing- information given:  Croitoru, Mihai, MD  Labs including potassium level are OK. No need for potassium supplement.  Will also mail a copy of results. Asked for the patient to fill out DPR at this office the next time she comes in- verbalized understanding of all information.

## 2022-11-01 ENCOUNTER — Other Ambulatory Visit (HOSPITAL_COMMUNITY): Payer: Self-pay

## 2022-11-01 ENCOUNTER — Inpatient Hospital Stay: Payer: Medicare Other | Admitting: Oncology

## 2022-11-01 ENCOUNTER — Telehealth: Payer: Self-pay

## 2022-11-01 ENCOUNTER — Inpatient Hospital Stay: Payer: Medicare Other

## 2022-11-01 ENCOUNTER — Encounter: Payer: Self-pay | Admitting: *Deleted

## 2022-11-01 VITALS — BP 148/57 | HR 77 | Temp 98.1°F | Resp 18 | Ht 61.0 in | Wt 123.0 lb

## 2022-11-01 DIAGNOSIS — Z8041 Family history of malignant neoplasm of ovary: Secondary | ICD-10-CM | POA: Diagnosis not present

## 2022-11-01 DIAGNOSIS — N63 Unspecified lump in unspecified breast: Secondary | ICD-10-CM

## 2022-11-01 DIAGNOSIS — I2510 Atherosclerotic heart disease of native coronary artery without angina pectoris: Secondary | ICD-10-CM | POA: Diagnosis not present

## 2022-11-01 DIAGNOSIS — Z9049 Acquired absence of other specified parts of digestive tract: Secondary | ICD-10-CM | POA: Diagnosis not present

## 2022-11-01 DIAGNOSIS — Z171 Estrogen receptor negative status [ER-]: Secondary | ICD-10-CM | POA: Diagnosis not present

## 2022-11-01 DIAGNOSIS — C50912 Malignant neoplasm of unspecified site of left female breast: Secondary | ICD-10-CM | POA: Diagnosis not present

## 2022-11-01 DIAGNOSIS — Z803 Family history of malignant neoplasm of breast: Secondary | ICD-10-CM | POA: Diagnosis not present

## 2022-11-01 DIAGNOSIS — R918 Other nonspecific abnormal finding of lung field: Secondary | ICD-10-CM | POA: Diagnosis not present

## 2022-11-01 DIAGNOSIS — Z950 Presence of cardiac pacemaker: Secondary | ICD-10-CM | POA: Diagnosis not present

## 2022-11-01 DIAGNOSIS — J90 Pleural effusion, not elsewhere classified: Secondary | ICD-10-CM | POA: Diagnosis not present

## 2022-11-01 DIAGNOSIS — C7951 Secondary malignant neoplasm of bone: Secondary | ICD-10-CM | POA: Diagnosis not present

## 2022-11-01 DIAGNOSIS — Z800 Family history of malignant neoplasm of digestive organs: Secondary | ICD-10-CM | POA: Diagnosis not present

## 2022-11-01 LAB — CBC WITH DIFFERENTIAL (CANCER CENTER ONLY)
Abs Immature Granulocytes: 0.03 10*3/uL (ref 0.00–0.07)
Basophils Absolute: 0.1 10*3/uL (ref 0.0–0.1)
Basophils Relative: 1 %
Eosinophils Absolute: 0.1 10*3/uL (ref 0.0–0.5)
Eosinophils Relative: 1 %
HCT: 40.2 % (ref 36.0–46.0)
Hemoglobin: 13.2 g/dL (ref 12.0–15.0)
Immature Granulocytes: 0 %
Lymphocytes Relative: 22 %
Lymphs Abs: 1.6 10*3/uL (ref 0.7–4.0)
MCH: 29.9 pg (ref 26.0–34.0)
MCHC: 32.8 g/dL (ref 30.0–36.0)
MCV: 91.2 fL (ref 80.0–100.0)
Monocytes Absolute: 0.8 10*3/uL (ref 0.1–1.0)
Monocytes Relative: 12 %
Neutro Abs: 4.7 10*3/uL (ref 1.7–7.7)
Neutrophils Relative %: 64 %
Platelet Count: 192 10*3/uL (ref 150–400)
RBC: 4.41 MIL/uL (ref 3.87–5.11)
RDW: 15.8 % — ABNORMAL HIGH (ref 11.5–15.5)
WBC Count: 7.2 10*3/uL (ref 4.0–10.5)
nRBC: 0 % (ref 0.0–0.2)

## 2022-11-01 LAB — CMP (CANCER CENTER ONLY)
ALT: 9 U/L (ref 0–44)
AST: 22 U/L (ref 15–41)
Albumin: 3.9 g/dL (ref 3.5–5.0)
Alkaline Phosphatase: 120 U/L (ref 38–126)
Anion gap: 8 (ref 5–15)
BUN: 20 mg/dL (ref 8–23)
CO2: 25 mmol/L (ref 22–32)
Calcium: 9.3 mg/dL (ref 8.9–10.3)
Chloride: 104 mmol/L (ref 98–111)
Creatinine: 0.8 mg/dL (ref 0.44–1.00)
GFR, Estimated: >60 mL/min (ref >60)
Glucose, Bld: 86 mg/dL (ref 70–99)
Potassium: 3.9 mmol/L (ref 3.5–5.1)
Sodium: 137 mmol/L (ref 135–145)
Total Bilirubin: 1.4 mg/dL — ABNORMAL HIGH (ref 0.3–1.2)
Total Protein: 6.7 g/dL (ref 6.5–8.1)

## 2022-11-01 MED ORDER — CAPECITABINE 500 MG PO TABS
ORAL_TABLET | ORAL | 0 refills | Status: DC
Start: 2022-11-08 — End: 2022-11-02

## 2022-11-01 NOTE — Telephone Encounter (Signed)
Oral Oncology Patient Advocate Encounter  After completing a benefits investigation, prior authorization for Capecitabine is not required at this time through [facility] Part D.  Patient's copay is $8.00.     Ardeen Fillers, CPhT Oncology Pharmacy Patient Advocate  [facility]  [phone] (phone) [phone] (fax) 11/01/2022 3:04 PM

## 2022-11-01 NOTE — Progress Notes (Signed)
PATIENT NAVIGATOR PROGRESS NOTE  Name: MEGHA OHLE Date: 11/01/2022 MRN: [id number]  DOB: 01-23-1923   Reason for visit:  F/U appt  Comments:  Met with Ms Nauta and her daughters Erskine Squibb and [name] Given information on Capecitabine and pill box provided. Given contact information to Oral Oncology team for prescription fill Encouraged to call with any questions    Time spent counseling/coordinating care: > 60 minutes

## 2022-11-01 NOTE — Progress Notes (Signed)
[facility] OFFICE PROGRESS NOTE   Diagnosis: Breast cancer  INTERVAL HISTORY:   Ms. Hirano returns as scheduled.  She is here with her daughters.  She reports feeling well.  Her daughters report she has intermittent discomfort at the neck, back, and right hip.  She was told the hip discomfort is secondary to "arthritis ".  She is not taking pain medication.  She is dressing the left breast wound daily.  Objective:  Vital signs in last 24 hours:  Blood pressure (!) 148/57, pulse 77, temperature 98.1 F (36.7 C), temperature source Temporal, resp. rate 18, height 5\' 1"  (1.549 m), weight 123 lb (55.8 kg), SpO2 100%.    Lymphatics: 1.5 cm yellow Resp: Lungs clear bilaterally Cardio: Regular rate and rhythm GI: No hepatosplenomegaly Vascular: No leg edema Breast: Firm 3-4 cm inferior left breast mass with skin ulceration, no bleeding   Lab Results:  Lab Results  Component Value Date   WBC 8.7 07/14/2022   HGB 12.1 07/14/2022   HCT 38.5 07/14/2022   MCV 100.8 (H) 07/14/2022   PLT 153 07/14/2022   NEUTROABS 3.6 06/13/2008    CMP  Lab Results  Component Value Date   NA 143 10/26/2022   K 5.0 10/26/2022   CL 104 10/26/2022   CO2 22 10/26/2022   GLUCOSE 89 10/26/2022   BUN 15 10/26/2022   CREATININE 0.84 10/26/2022   CALCIUM 9.6 10/26/2022   GFRNONAA >60 07/14/2022   GFRAA 62 (L) 09/06/2012     Medications: I have reviewed the patient's current medications.   Assessment/Plan: Breast cancer  left breast mass, palpable left axillary lymph node-clinical presentation consistent with breast cancer Punch biopsy of left chest mass 09/26/2022-poorly differentiated adenocarcinoma, CK7 positive, TTF-1, GATA3, and ER negative, tumor infiltrating the dermis with extension to the subcutis and no connection to the overlying epidermis.,  ER 0%, PR 0%, Ki-67 50%, HER2 2+, HER2 negative by FISH CT chest 10/17/2022-left breast mass, enlarged left axillary and  mediastinal lymph nodes, asymmetric fullness of the left hilum, mild pulmonary edema and small right greater than left pleural effusions, scattered solid pulmonary nodules, largest 6 mm in the right upper lobe, chronic fractures of the anterior left third and fourth ribs-irregular appearance of left fourth rib concerning for a pathologic fracture PET 10/27/2022-hypermetabolic left breast mass with metastatic left axillary, subpectoral, mediastinal, and hilar lymphadenopathy.  Potential bilateral adrenal metastases.  Widespread bone metastases  History of atrial flutter and heart block, pacemaker G4, P3, 1 miscarriage Family history of breast, colon, and ovarian cancer CAD 6.   History of a "leg clot " 7.   Cholecystectomy 8.   "Pleural effusion "noted     Disposition: Ms. Lips has been diagnosed with breast cancer.  I reviewed the staging PET findings and images with Ms. Schlauch and her daughters.  She has metastatic breast cancer.  The tumor is triple negative.  She is not a candidate for hormonal therapy.  We discussed comfort care versus a trial of capecitabine.  We reviewed potential toxicities associated with capecitabine including the chance of nausea, mucositis, diarrhea, hematologic toxicity, cardiac toxicity, rash, sun sensitivity, hyperpigmentation, and hand/foot syndrome. Ms. Osterkamp understands no therapy would be curative.  The goals of treatment are to palliate symptoms and potentially prolong survival.  She indicates she would like to proceed with a trial of capecitabine.  She will hold capecitabine and contact us if she develops diarrhea.  The plan is to begin capecitabine on 11/08/2022.  Capecitabine  will be given on a 7-day on/7-day off schedule.  She will return for an office visit in 30 2024, prior to the third week of capecitabine.  Thornton Papas, MD  11/01/2022  2:07 PM

## 2022-11-02 ENCOUNTER — Telehealth: Payer: Self-pay

## 2022-11-02 ENCOUNTER — Telehealth: Payer: Self-pay | Admitting: *Deleted

## 2022-11-02 ENCOUNTER — Other Ambulatory Visit: Payer: Self-pay

## 2022-11-02 ENCOUNTER — Other Ambulatory Visit (HOSPITAL_COMMUNITY): Payer: Self-pay

## 2022-11-02 DIAGNOSIS — N63 Unspecified lump in unspecified breast: Secondary | ICD-10-CM

## 2022-11-02 LAB — CANCER ANTIGEN 27.29: CA 27.29: 23.4 U/mL (ref 0.0–38.6)

## 2022-11-02 MED ORDER — CAPECITABINE 500 MG PO TABS
1000.0000 mg | ORAL_TABLET | Freq: Two times a day (BID) | ORAL | 0 refills | Status: DC
  Filled 2022-11-02: qty 56, 28d supply, fill #0

## 2022-11-02 NOTE — Progress Notes (Signed)
Patient education documented in EPIC note on 11/02/22.

## 2022-11-02 NOTE — Telephone Encounter (Signed)
Daughter called to inquire Dr. Kalman Drape opinion on waiting to begin the Xeloda ~ 11/17/22 to allow patient and family to think about it more.

## 2022-11-02 NOTE — Progress Notes (Signed)
Specialty Pharmacy Initial Fill Coordination Note  Toni Sanders is a 89 y.o. female contacted today regarding refills of specialty medication(s) Capecitabine .  Patient requested [name] at [facility] [place]  on 11/03/22   Medication will be filled on 11/03/22.   Patient is aware of $8.00 copayment.

## 2022-11-02 NOTE — Telephone Encounter (Signed)
Patient successfully OnBoarded and drug education provided by pharmacist. Medication scheduled to be picked up on Friday, 11/03/22 from [facility]. Patient also knows to call me at [phone] with any questions or concerns regarding receiving medication or if there is any unexpected change in co-pay.    Ardeen Fillers, CPhT Oncology Pharmacy Patient Advocate  [facility]  [phone] (phone) [phone] (fax) 11/02/2022 2:02 PM

## 2022-11-02 NOTE — Telephone Encounter (Signed)
Clinical Pharmacist Encounter   Patient Education I spoke with patient for overview of new oral chemotherapy medication: Xeloda (capecitabine) for the treatment of triple negative metastatic breast cancer, planned duration until disease progression or intolerable side effects.   Counseled patient on administration, dosing, side effects, monitoring, drug-food interactions, safe handling, storage, and disposal. Patient will take 2 tablets (1000 mg) BID within 30 minutes of a meal for 7 days, then hold 7 days. Repeat every 14 days.  Side effects include but not limited to: Diarrhea, Mouth sores, Fatigue, Hand-and-foot syndrome, Nausea/Vomiting, and decrease in cell counts. Diarrhea: Per MD, if patient experiences any diarrhea to hold the medication and call their office. Advised the patient they can use loperamide to control the diarrhea if she develops any. Mouth sore: Recommend the patient call their provider if they experience any mouth sores. They can ask their provider for a prescription of Magic Mouth Wash to help with numbing and healing of the sores. Fatigue: Advised that fatigue can occur. Call the provider if the patient starts to experience any fatigue that impacts their baseline.  Hand-and-foot syndrome. Advised the patient to use creams that have 10-20% Urea to help reduce the risk of developing this side effect. Call their provider if the patient develops intolerable hand or foot peeling. Nausea/Vomiting: Advised the patient to call their provider if they experience significant nausea/vomiting.  Decrease in cell counts: Advised the patient that this medication can reduce their cell counts, but their provider will be monitoring their counts during their check-ups  Reviewed with patient importance of keeping a medication schedule and plan for any missed doses.  After discussion with patient no patient barriers to medication adherence identified.  It was mentioned that the patient is  moving to [place] in the next few weeks.  Ms. Toni Shiffman, Ms [name] daughter, voiced understanding and appreciation. All questions answered. Medication handout and calendar was mailed: [address] [place], [place] [address]  Provided patient with Oral Chemotherapy Navigation Clinic phone number. Patient knows to call the office with questions or concerns. Oral Chemotherapy Navigation Clinic will continue to follow.  [name], PharmD PGY1 Pharmacy Resident - [facility] [phone]  11/02/2022 1:59 PM

## 2022-11-02 NOTE — Telephone Encounter (Addendum)
Clinical Pharmacist Encounter   Received new prescription for Xeloda (capecitabine) for the treatment of triple negative metastatic breast cancer with widespread bone mets. Planned duration until disease progression or the development of intolerable side effects.  Labs from 11/01/2022 assessed, CBC and BMP assessed and relevant labs appropriate. No indication holding the medication at this time. Prescription dose and frequency assessed.  Patient GFR is about 30 - 35 mL/min. Dosed at 1,000 BID.  To increase tolerability, dosing is altered to 7 days on with 7 days off. Repeat every 14 days.  Planned start 11/08/2022  Current medication list in Epic reviewed, 0 DDIs with Xeloda (capecitabine) identified.  Evaluated chart and no patient barriers to medication adherence identified.   Prescription has been e-scribed to the [facility] for benefits analysis and approval.  Oral Oncology Clinic will continue to follow for insurance authorization, copayment issues, initial counseling and start date.  Patient agreed to treatment on 11/01/2022 per MD documentation.  [name], PharmD PGY1 Pharmacy Resident - [facility] [phone]  11/02/2022 10:20 AM

## 2022-11-03 ENCOUNTER — Telehealth: Payer: Self-pay | Admitting: *Deleted

## 2022-11-03 ENCOUNTER — Encounter: Payer: Self-pay | Admitting: Family Medicine

## 2022-11-03 NOTE — Telephone Encounter (Signed)
Called daughter, Shawnique Mariotti back and informed her Dr. Truett Perna said OK to delay start of xeloda to further discuss and weigh risk/benefit. Would delay OV to before 3rd week of Xeloda. Reminded her that he did not order full dosing due to her age/renal status and all AE's are reversible when drug stopped and she can stop at any time she wishes. They plan to discuss over the next few days. She requests to keep the 10/30 lab/OV for now.

## 2022-11-03 NOTE — Telephone Encounter (Signed)
Call from sister, [name] reporting that Adelin wants to try the Xeloda and wishes to start when family will be with her to help manage any side effects. She wants to begin on 11/23/22 and cancel the MD visit and see Dr. Truett Perna at the time point he suggested after starting medication. She reports sister, Erskine Squibb is resistant to her taking the chemo due to husband been on the internet and telling her all the negative sides to the drug. She feels it is her mother's decision and no one else. Scheduling message sent to change lab/OV from 10/30 to 11/8 or 11/11

## 2022-11-09 ENCOUNTER — Telehealth: Payer: Self-pay | Admitting: *Deleted

## 2022-11-09 MED ORDER — PROCHLORPERAZINE MALEATE 5 MG PO TABS
5.0000 mg | ORAL_TABLET | Freq: Four times a day (QID) | ORAL | 1 refills | Status: DC | PRN
Start: 2022-11-09 — End: 2023-09-13

## 2022-11-09 MED ORDER — MAGIC MOUTHWASH: 5.0000 mL | Freq: Four times a day (QID) | ORAL | 1 refills | Status: DC | PRN

## 2022-11-09 MED ORDER — ONDANSETRON HCL 8 MG PO TABS
8.0000 mg | ORAL_TABLET | Freq: Three times a day (TID) | ORAL | 1 refills | Status: DC | PRN
Start: 2022-11-09 — End: 2023-09-13

## 2022-11-09 NOTE — Telephone Encounter (Signed)
Daughter, Erskine Squibb requesting antiemetics and MMW be called in to pharmacy to have on hand.  Scripts sent and MMW will be on file for patient to call if needed.  Notified daughter that scripts were sent in and she wants to fill the MMW now, since patient will be in [place] when she starts chemo. Informed her of shelf life and to keep it in fridge.  She is concerned that she will need wound care when in [place] and is afraid that won't be available there. Assured her we can obtain home wound care if needed. She wishes to discuss this w/MD at next visit.

## 2022-11-15 ENCOUNTER — Telehealth: Payer: Self-pay | Admitting: Genetic Counselor

## 2022-11-15 NOTE — Telephone Encounter (Addendum)
Patient's daughter called to set up an appointment.  She wanted to coordinate with one of her mother's other appointments already scheduled, as her mother is going back and forth between [place] and [place].  She is in town tomorrow (10/10) and then also 10/22 and 11/11.. She asked to take the 9 am appointment on 10/10 and also the 11/11 appointment.  Her mother is 6 and wants to do the testing, and if they make the appointment tomorrow they will cancel the 11/11 appointment. If they cancel last minute the 10/10 appointment, they will keep the 11/11. Discussed the process of genetic counseling and testing.

## 2022-11-16 ENCOUNTER — Telehealth: Payer: Self-pay | Admitting: Genetic Counselor

## 2022-11-16 ENCOUNTER — Other Ambulatory Visit: Payer: Medicare Other

## 2022-11-16 ENCOUNTER — Inpatient Hospital Stay: Payer: Medicare Other | Admitting: Genetic Counselor

## 2022-11-16 NOTE — Telephone Encounter (Signed)
Patient's daughter called to cancel 10/10 appointment.  She would like her mother's blood drawn for genetics at [facility] on 11/11 since they already have an appointment with Dr. Truett Perna and will need blood drawn there.  Orders will still need to be placed.

## 2022-11-17 ENCOUNTER — Other Ambulatory Visit (HOSPITAL_COMMUNITY): Payer: Self-pay

## 2022-11-20 ENCOUNTER — Ambulatory Visit (HOSPITAL_COMMUNITY): Payer: Medicare Other

## 2022-11-20 ENCOUNTER — Telehealth (HOSPITAL_COMMUNITY): Payer: Self-pay | Admitting: Cardiovascular Disease

## 2022-11-20 NOTE — Telephone Encounter (Signed)
11/20/2022 1:14 PM RJ:JOACZY, CHLOE R  Cancel Rsn: Patient (pt daughter states pt was only having this test to be considered for being placed under anesthesia for a procedure but she is no longer having it and she also stated pt is not feeling well. does not wish to r/s at this time)  Order will be removed from Echo W[facility].

## 2022-11-21 ENCOUNTER — Other Ambulatory Visit: Payer: Self-pay

## 2022-11-21 DIAGNOSIS — K080 Exfoliation of teeth due to systemic causes: Secondary | ICD-10-CM | POA: Diagnosis not present

## 2022-11-22 NOTE — Progress Notes (Deleted)
Cardiology Office Note:    Date:  11/22/2022   ID:  Toni Sanders, DOB [birth year], MRN [id number]  PCP:  Geoffry Paradise, MD   [facility] Providers Cardiologist:  Thurmon Fair, MD { Click to update primary MD,subspecialty MD or APP then REFRESH:1}    Referring MD: Geoffry Paradise, MD   No chief complaint on file. ***  History of Present Illness:    Toni Sanders is a 89 y.o. female with a hx of CHB s/p dual-chamber PPM, hypertension, paroxysmal atrial flutter/atrial fibrillation, history of rare NSVT, now with permanent atrial fibrillation.  She now has 100% ventricular pacing and does not have any underlying idioventricular escape rhythm.  She is pacemaker dependent.  Unfortunately she has been diagnosed with left sided breast cancer following with oncology.  CT chest concerning for metastatic malignancy with enlarged lymph nodes and also showed severe coronary artery calcification.  She last saw Dr. Royann Shivers in clinic 10/26/2022 with symptoms describing PND and shortness of breath.  This prompted an ER visit in which she received diuretics with improvement of her shortness of breath, also diagnosed with pleural effusions.  Xarelto was stopped due to left breast bleeding.  Given rapid improvement with diuretics, Dr. Royann Shivers felt her pleural effusions were likely due to heart failure rather than malignancy.  She was continued on 20 mg Lasix daily and recommended low-sodium diet.  She denied angina, no further workup for coronary and aortic atherosclerotic calcification.  Did not start a statin in this 89 year old patient.  Echocardiogram was ordered but not completed.     Permanent atrial fibrillation CHB Pacemaker dependent Rate controlled/heart block    Elevated stroke risk - Xarelto was discontinued due to left breast bleeding in the setting of cancer   Hypertension - Controlled   History of vasovagal syncope - She manages this by laying down when  she has symptoms - She has not had syncope in over 4 years   Coronary artery calcification Aortic atherosclerosis - echo showed ______          Past Medical History:  Diagnosis Date   Atrial flutter (HCC) 2010   Bradycardia 2010   Complete heart block (HCC) 2010   s/p PPM by Dr Deborah Chalk (MDT) 06/16/08   H/O cardiovascular stress test 09/22/10   no ischemia, EF >60%   LVH (left ventricular hypertrophy) 09/22/10   Echo >55% mild LVH    Osteoporosis     Past Surgical History:  Procedure Laterality Date   CHOLECYSTECTOMY  1994   PACEMAKER GENERATOR CHANGE  09/06/12   new medtronic generator Adapta L secondary to early battery depletion   PACEMAKER GENERATOR CHANGE N/A 09/06/2012   Procedure: PACEMAKER GENERATOR CHANGE;  Surgeon: Thurmon Fair, MD;  Location: [facility] CATH [facility];  Service: Cardiovascular;  Laterality: N/A;   PACEMAKER PLACEMENT  06/16/08   Implantation of dual-chamber Medtronic PPMY by Dr Deborah Chalk    Current Medications: No outpatient medications have been marked as taking for the 11/28/22 encounter (Appointment) with Marcelino Duster, PA.     Allergies:   Anesthetics, amide; Anesthetics, ester; Anesthetics, halogenated; Morphine and codeine; and Percocet [oxycodone-acetaminophen]   Social History   Socioeconomic History   Marital status: Married    Spouse name: Not on file   Number of children: Not on file   Years of education: Not on file   Highest education level: Not on file  Occupational History   Not on file  Tobacco Use   Smoking status: Never  Smokeless tobacco: Never  Substance and Sexual Activity   Alcohol use: No   Drug use: No   Sexual activity: Not on file  Other Topics Concern   Not on file  Social History Narrative   Lives with spouse in [place].   Social Determinants of Health   Financial Resource Strain: Not on file  Food Insecurity: No Food Insecurity (10/17/2022)   Hunger Vital Sign    Worried About Running Out of Food in  the Last Year: Never true    Ran Out of Food in the Last Year: Never true  Transportation Needs: No Transportation Needs (10/17/2022)   PRAPARE - Administrator, Civil Service (Medical): No    Lack of Transportation (Non-Medical): No  Physical Activity: Not on file  Stress: Not on file  Social Connections: Unknown (06/21/2021)   Received [facility]   Social Network    Social Network: Not on file     Family History: The patient's ***family history includes Cancer - Ovarian in her maternal grandmother; Cancer - Prostate in her father; Hypertension in her unknown relative; Pneumonia in her paternal grandmother.  ROS:   Please see the history of present illness.    *** All other systems reviewed and are negative.  EKGs/Labs/Other Studies Reviewed:    The following studies were reviewed today: ***      Recent Labs: 10/26/2022: BNP 168.2 11/01/2022: ALT 9; BUN 20; Creatinine 0.80; Hemoglobin 13.2; Platelet Count 192; Potassium 3.9; Sodium 137  Recent Lipid Panel No results found for: "CHOL", "TRIG", "HDL", "CHOLHDL", "VLDL", "LDLCALC", "LDLDIRECT"   Risk Assessment/Calculations:   {Does this patient have ATRIAL FIBRILLATION?:[id number]}  No BP recorded.  {Refresh Note OR Click here to enter BP  :1}***         Physical Exam:    VS:  There were no vitals taken for this visit.    Wt Readings from Last 3 Encounters:  11/01/22 123 lb (55.8 kg)  10/26/22 126 lb 12.8 oz (57.5 kg)  10/19/22 125 lb (56.7 kg)     GEN: *** Well nourished, well developed in no acute distress HEENT: Normal NECK: No JVD; No carotid bruits LYMPHATICS: No lymphadenopathy CARDIAC: ***RRR, no murmurs, rubs, gallops RESPIRATORY:  Clear to auscultation without rales, wheezing or rhonchi  ABDOMEN: Soft, non-tender, non-distended MUSCULOSKELETAL:  No edema; No deformity  SKIN: Warm and dry NEUROLOGIC:  Alert and oriented x 3 PSYCHIATRIC:  Normal affect   ASSESSMENT:     No diagnosis found. PLAN:    In order of problems listed above:  ***      {Are you ordering a CV Procedure (e.g. stress test, cath, DCCV, TEE, etc)?   Press F2        :[id number]}    Medication Adjustments/Labs and Tests Ordered: Current medicines are reviewed at length with the patient today.  Concerns regarding medicines are outlined above.  No orders of the defined types were placed in this encounter.  No orders of the defined types were placed in this encounter.   There are no Patient Instructions on file for this visit.   Signed, Marcelino Duster, [place]  11/22/2022 8:48 PM    [facility]

## 2022-11-27 ENCOUNTER — Other Ambulatory Visit: Payer: Self-pay

## 2022-11-27 ENCOUNTER — Other Ambulatory Visit (HOSPITAL_COMMUNITY): Payer: Self-pay

## 2022-11-27 ENCOUNTER — Other Ambulatory Visit: Payer: Self-pay | Admitting: Oncology

## 2022-11-27 DIAGNOSIS — N63 Unspecified lump in unspecified breast: Secondary | ICD-10-CM

## 2022-11-27 NOTE — Progress Notes (Signed)
Clinical Intervention Note  Clinical Intervention Notes: Spoke with daughter Erskine Squibb who authorizes pick up for patient's medication but is unable to answer questions regarding her mom's therapy. Erskine Squibb reports Xeloda 2 QD 7 on, 7 off. Prescription reads Xeloda 2 BID, 7 on, 7 off. Erskine Squibb provided patient's phone number [phone] (which the system has as Mary's phone number) to reach out to answer the assessement questions. I called and left a message for a return call. Pt should have enough through 11/8. Will need to call back Erskine Squibb on Wednesday with either update.   Clinical Intervention Outcomes: Improved therapy adherence   Bobette Mo Specialty Pharmacist

## 2022-11-27 NOTE — Progress Notes (Signed)
Specialty Pharmacy Refill Coordination Note  Toni Sanders is a 89 y.o. female contacted today regarding refills of specialty medication(s) Capecitabine   Patient requested Toni Sanders at [facility] at [place] date: 12/19/22   Medication will be filled on 12/18/22. Pending Refill Request.   Pt has first follow-up appointment with Dr. Truett Perna on 12/18/22. Pt is aware that refill request may not be renewed until after appointment & labs. Pt lives with daughter Toni Sanders, in [place] but will be in town for appointment with her other daughter Toni Sanders, and would like to pick up medication if possible within the next day or so. Pt should start Cycle 2 around 12/21/22.

## 2022-11-27 NOTE — Progress Notes (Signed)
Spoke with Daughter, Toni Sanders who Lasharra is currently living with now. Confirmed current dose of Xeloda is correct: 500mg  2 BID 7 days on, 7 days off. Pt started therapy on Thursday, 11/17. Toni Sanders has a calendar and is very diligent with the medication. Toni Sanders states that Toni Sanders is doing well on Xeloda and has a follow-up appt with Dr. Alcide Evener on 11/11. Specialty Outreach assessments are complete and refill for Xeloda has been requested. Specialty Services were reviewed with [facility] and pharmacy phone number was also provided. Toni Sanders was very appreciated of the phone call. Clinical Intervention closed.

## 2022-11-27 NOTE — Progress Notes (Signed)
Specialty Pharmacy Ongoing Clinical Assessment Note  Toni Sanders is a 89 y.o. female who is being followed by the specialty pharmacy service for RxSp Oncology   Patient's specialty medication(s) reviewed today: Capecitabine   Missed doses in the last 4 weeks: 0   Patient/Caregiver did not have any additional questions or concerns.   Therapeutic benefit summary: Unable to assess   Adverse events/side effects summary: No adverse events/side effects   Patient's therapy is appropriate to: Continue    Goals Addressed             This Visit's Progress    Slow Disease Progression       Patient is on track. Patient will maintain adherence         Follow up:  3 months  Bobette Mo Specialty Pharmacist

## 2022-11-28 ENCOUNTER — Ambulatory Visit: Payer: Medicare Other | Admitting: Physician Assistant

## 2022-11-28 ENCOUNTER — Other Ambulatory Visit (HOSPITAL_COMMUNITY): Payer: Self-pay

## 2022-11-28 ENCOUNTER — Other Ambulatory Visit: Payer: Self-pay

## 2022-11-28 MED ORDER — CAPECITABINE 500 MG PO TABS
1000.0000 mg | ORAL_TABLET | Freq: Two times a day (BID) | ORAL | 0 refills | Status: DC
  Filled 2022-11-28: qty 56, 28d supply, fill #0

## 2022-12-05 ENCOUNTER — Telehealth: Payer: Self-pay | Admitting: Genetic Counselor

## 2022-12-05 NOTE — Telephone Encounter (Signed)
Called patient's daughter requesting to move genetic counseling appt to 11am on 11/11.  LVM requesting call back.

## 2022-12-06 ENCOUNTER — Other Ambulatory Visit: Payer: Medicare Other

## 2022-12-06 ENCOUNTER — Ambulatory Visit: Payer: Medicare Other | Admitting: Nurse Practitioner

## 2022-12-18 ENCOUNTER — Telehealth: Payer: Self-pay | Admitting: Genetic Counselor

## 2022-12-18 ENCOUNTER — Other Ambulatory Visit: Payer: Self-pay

## 2022-12-18 ENCOUNTER — Inpatient Hospital Stay: Payer: Medicare Other | Admitting: Nurse Practitioner

## 2022-12-18 ENCOUNTER — Inpatient Hospital Stay: Payer: Medicare Other | Attending: Oncology

## 2022-12-18 ENCOUNTER — Other Ambulatory Visit: Payer: Self-pay | Admitting: Genetic Counselor

## 2022-12-18 ENCOUNTER — Other Ambulatory Visit: Payer: Medicare Other

## 2022-12-18 ENCOUNTER — Other Ambulatory Visit (HOSPITAL_COMMUNITY): Payer: Self-pay

## 2022-12-18 ENCOUNTER — Encounter: Payer: Self-pay | Admitting: Nurse Practitioner

## 2022-12-18 ENCOUNTER — Inpatient Hospital Stay: Payer: Medicare Other | Admitting: Genetic Counselor

## 2022-12-18 VITALS — BP 155/62 | HR 77 | Temp 97.7°F | Ht 61.0 in | Wt 124.5 lb

## 2022-12-18 DIAGNOSIS — Z171 Estrogen receptor negative status [ER-]: Secondary | ICD-10-CM | POA: Insufficient documentation

## 2022-12-18 DIAGNOSIS — I2510 Atherosclerotic heart disease of native coronary artery without angina pectoris: Secondary | ICD-10-CM | POA: Diagnosis not present

## 2022-12-18 DIAGNOSIS — J90 Pleural effusion, not elsewhere classified: Secondary | ICD-10-CM | POA: Insufficient documentation

## 2022-12-18 DIAGNOSIS — Z9049 Acquired absence of other specified parts of digestive tract: Secondary | ICD-10-CM | POA: Insufficient documentation

## 2022-12-18 DIAGNOSIS — C50912 Malignant neoplasm of unspecified site of left female breast: Secondary | ICD-10-CM | POA: Diagnosis not present

## 2022-12-18 DIAGNOSIS — Z800 Family history of malignant neoplasm of digestive organs: Secondary | ICD-10-CM | POA: Insufficient documentation

## 2022-12-18 DIAGNOSIS — R918 Other nonspecific abnormal finding of lung field: Secondary | ICD-10-CM | POA: Diagnosis not present

## 2022-12-18 DIAGNOSIS — N63 Unspecified lump in unspecified breast: Secondary | ICD-10-CM | POA: Diagnosis not present

## 2022-12-18 DIAGNOSIS — Z8041 Family history of malignant neoplasm of ovary: Secondary | ICD-10-CM | POA: Insufficient documentation

## 2022-12-18 DIAGNOSIS — Z950 Presence of cardiac pacemaker: Secondary | ICD-10-CM | POA: Diagnosis not present

## 2022-12-18 DIAGNOSIS — C7951 Secondary malignant neoplasm of bone: Secondary | ICD-10-CM | POA: Diagnosis not present

## 2022-12-18 DIAGNOSIS — Z803 Family history of malignant neoplasm of breast: Secondary | ICD-10-CM | POA: Insufficient documentation

## 2022-12-18 LAB — CMP (CANCER CENTER ONLY)
ALT: 10 U/L (ref 0–44)
AST: 23 U/L (ref 15–41)
Albumin: 3.9 g/dL (ref 3.5–5.0)
Alkaline Phosphatase: 135 U/L — ABNORMAL HIGH (ref 38–126)
Anion gap: 8 (ref 5–15)
BUN: 18 mg/dL (ref 8–23)
CO2: 26 mmol/L (ref 22–32)
Calcium: 9.8 mg/dL (ref 8.9–10.3)
Chloride: 104 mmol/L (ref 98–111)
Creatinine: 0.8 mg/dL (ref 0.44–1.00)
GFR, Estimated: >60 mL/min (ref >60)
Glucose, Bld: 99 mg/dL (ref 70–99)
Potassium: 4.5 mmol/L (ref 3.5–5.1)
Sodium: 138 mmol/L (ref 135–145)
Total Bilirubin: 1.3 mg/dL — ABNORMAL HIGH (ref <1.2)
Total Protein: 7 g/dL (ref 6.5–8.1)

## 2022-12-18 LAB — CBC WITH DIFFERENTIAL (CANCER CENTER ONLY)
Abs Immature Granulocytes: 0.04 10*3/uL (ref 0.00–0.07)
Basophils Absolute: 0 10*3/uL (ref 0.0–0.1)
Basophils Relative: 1 %
Eosinophils Absolute: 0.1 10*3/uL (ref 0.0–0.5)
Eosinophils Relative: 1 %
HCT: 41.5 % (ref 36.0–46.0)
Hemoglobin: 14.3 g/dL (ref 12.0–15.0)
Immature Granulocytes: 1 %
Lymphocytes Relative: 18 %
Lymphs Abs: 1 10*3/uL (ref 0.7–4.0)
MCH: 31.9 pg (ref 26.0–34.0)
MCHC: 34.5 g/dL (ref 30.0–36.0)
MCV: 92.6 fL (ref 80.0–100.0)
Monocytes Absolute: 0.6 10*3/uL (ref 0.1–1.0)
Monocytes Relative: 11 %
Neutro Abs: 3.8 10*3/uL (ref 1.7–7.7)
Neutrophils Relative %: 68 %
Platelet Count: 139 10*3/uL — ABNORMAL LOW (ref 150–400)
RBC: 4.48 MIL/uL (ref 3.87–5.11)
RDW: 18.3 % — ABNORMAL HIGH (ref 11.5–15.5)
WBC Count: 5.6 10*3/uL (ref 4.0–10.5)
nRBC: 0 % (ref 0.0–0.2)

## 2022-12-18 MED ORDER — CAPECITABINE 500 MG PO TABS
1000.0000 mg | ORAL_TABLET | Freq: Two times a day (BID) | ORAL | 0 refills | Status: DC
  Filled 2022-12-18: qty 56, 28d supply, fill #0

## 2022-12-18 NOTE — Progress Notes (Signed)
  Toni Sanders OFFICE PROGRESS NOTE   Diagnosis: Breast cancer  INTERVAL HISTORY:   Toni Sanders returns as scheduled.  She began capecitabine 7 days on/7 days off 11/23/2022.  She denies nausea/vomiting.  No mouth sores.  No diarrhea.  No hand or foot pain or redness.  She reports having excessive callus formation on both feet prior to beginning Xeloda.  Objective:  Vital signs in last 24 hours:  Blood pressure (!) 155/62, pulse 77, temperature 97.7 F (36.5 C), temperature source Temporal, height 5\' 1"  (1.549 m), weight 124 lb 8 oz (56.5 kg), SpO2 100%.    HEENT: No thrush or ulcers. Lymphatics: Firm approximate 1 cm left axillary lymph node. Resp: Lungs clear bilaterally. Cardio: Regular rate and rhythm. GI: No hepatosplenomegaly. Vascular: No leg edema. Breast: Firm nodular approximate 4 cm left breast mass with ulceration.  Small amount of bloody drainage on the dressing. Skin: Palms and soles with mild erythema.  Several calluses present on soles bilaterally.  Lab Results:  Lab Results  Component Value Date   WBC 5.6 12/18/2022   HGB 14.3 12/18/2022   HCT 41.5 12/18/2022   MCV 92.6 12/18/2022   PLT PENDING 12/18/2022   NEUTROABS 3.8 12/18/2022    Imaging:  No results found.  Medications: I have reviewed the patient's current medications.  Assessment/Plan: Breast cancer  left breast mass, palpable left axillary lymph node-clinical presentation consistent with breast cancer Punch biopsy of left chest mass 09/26/2022-poorly differentiated adenocarcinoma, CK7 positive, TTF-1, GATA3, and ER negative, tumor infiltrating the dermis with extension to the subcutis and no connection to the overlying epidermis.,  ER 0%, PR 0%, Ki-67 50%, HER2 2+, HER2 negative by FISH CT chest 10/17/2022-left breast mass, enlarged left axillary and mediastinal lymph nodes, asymmetric fullness of the left hilum, mild pulmonary edema and small right greater than left pleural  effusions, scattered solid pulmonary nodules, largest 6 mm in the right upper lobe, chronic fractures of the anterior left third and fourth ribs-irregular appearance of left fourth rib concerning for a pathologic fracture PET 10/27/2022-hypermetabolic left breast mass with metastatic left axillary, subpectoral, mediastinal, and hilar lymphadenopathy.  Potential bilateral adrenal metastases.  Widespread bone metastases Xeloda 1000 mg twice daily 7 days on/7 days off beginning 11/23/2022   History of atrial flutter and heart block, pacemaker G4, P3, 1 miscarriage Family history of breast, colon, and ovarian cancer CAD 6.   History of a "leg clot " 7.   Cholecystectomy 8.   "Pleural effusion "noted  Disposition: Toni Sanders appears stable.  She began Xeloda 7 days on/7 days off on 11/23/2022.  She seems to be tolerating well.  Plan to continue the same.  CBC and chemistry panel reviewed.  Labs adequate to continue as above.  She will return for lab and follow-up in 4 weeks.  We are available to see her sooner if needed.  Patient seen with Dr. Truett Perna.  Lonna Cobb ANP/GNP-BC   12/18/2022  1:54 PM This was a shared visit with Lonna Cobb.  Toni Sanders was interviewed and examined.  She appears to be tolerating the Xeloda well.  The left breast mass appears unchanged.  The palpable node in the left axilla appears to be slightly smaller.  The plan is to continue Xeloda.  I was present for greater than 50% of today's visit.  I performed medical decision making.  Mancel Bale, MD

## 2022-12-18 NOTE — Telephone Encounter (Signed)
Patient missed 10am genetic counseling appt on 12/18/2022.  Labs scheduled for later today while patient was at other appointments at [facility].    Called daughter Erskine Squibb to discuss genetic testing.  She disclosed the following family history:  Breast cancer in Ms. Willinger's daughter (d. 58s) Colon cancer in Ms. Recendiz's brother (dx after age 87) Unknown cancer (possibly prostate) in Ms. Carlyon's father Unknown cancer (possibly ovarian) in Ms. Ishee's maternal grandmother She had limited information about other family history.   Discussed purpose of genetic testing.  Daughter Erskine Squibb stated this was something her mother previously said she was interested in.  Attempted to call Ms. Schussler to discuss genetic testing but was unable to get informed consent given she was unable to hear. Lonna Cobb, NP agreed to make sure she was aware/okay with testing during appointment today.    [facility] CancerNext+RNA Panel ordered.

## 2022-12-18 NOTE — Progress Notes (Signed)
Spoke with Daughter Erskine Squibb & they will pick up in the morning from [facility]. Rx was sent in today 12/18/2022

## 2022-12-22 ENCOUNTER — Other Ambulatory Visit (HOSPITAL_COMMUNITY): Payer: Self-pay

## 2023-01-09 ENCOUNTER — Other Ambulatory Visit (HOSPITAL_COMMUNITY): Payer: Self-pay

## 2023-01-12 ENCOUNTER — Other Ambulatory Visit: Payer: Self-pay

## 2023-01-12 ENCOUNTER — Other Ambulatory Visit: Payer: Self-pay | Admitting: Nurse Practitioner

## 2023-01-12 ENCOUNTER — Other Ambulatory Visit (HOSPITAL_COMMUNITY): Payer: Self-pay

## 2023-01-12 DIAGNOSIS — N63 Unspecified lump in unspecified breast: Secondary | ICD-10-CM

## 2023-01-12 MED ORDER — CAPECITABINE 500 MG PO TABS
1000.0000 mg | ORAL_TABLET | Freq: Two times a day (BID) | ORAL | 0 refills | Status: DC
  Filled 2023-01-15: qty 56, 14d supply, fill #0

## 2023-01-12 NOTE — Progress Notes (Signed)
Specialty Pharmacy Refill Coordination Note  Toni Sanders is a 89 y.o. female contacted today regarding refills of specialty medication(s) Capecitabine Spoke with daughter [name].  Patient requested Pickup at [facility] at [place] date: 01/16/23   Medication will be filled on 01/15/23.  Refill request pending. Please notify if delayed.

## 2023-01-15 ENCOUNTER — Other Ambulatory Visit (HOSPITAL_COMMUNITY): Payer: Self-pay

## 2023-01-15 ENCOUNTER — Telehealth: Payer: Self-pay

## 2023-01-15 ENCOUNTER — Other Ambulatory Visit: Payer: Self-pay

## 2023-01-15 NOTE — Telephone Encounter (Signed)
The patient's daughter called to confirm the appointment and inquired whether the patient could begin her chemotherapy medication on the 13th instead of the 12th. The patient will be traveling to [place] after her cardiology appointment on the 12th, and the daughter prefers that her mother not travel immediately after initiating the new round of oral chemotherapy. I advised the daughter that she could discuss the oral chemo medication start date with the provider during the patient's visit on January 17, 2024.

## 2023-01-16 ENCOUNTER — Encounter: Payer: Self-pay | Admitting: Genetic Counselor

## 2023-01-16 DIAGNOSIS — Z1379 Encounter for other screening for genetic and chromosomal anomalies: Secondary | ICD-10-CM | POA: Insufficient documentation

## 2023-01-16 NOTE — Telephone Encounter (Signed)
POC testing follow-up: Revealed negative genetic testing and VUS in MET to daughter Erskine Squibb.  Discussed that her mother's breast cancer is most likely not hereditary given these results.  Cannot rule out that there is a change in a gene that she did not inherit that explains the family history or a mutation in a gene that cannot be picked up by today's technology.  Encouraged relatives to inform their providers about the family history of cancer to ensure appropriate screening, especially breast screening in females in the family.  Discussed VUS in MET should be treated like a negative result (should not impact medical management/should not test others in the family for VUS).

## 2023-01-17 ENCOUNTER — Inpatient Hospital Stay: Payer: Medicare Other

## 2023-01-17 ENCOUNTER — Encounter: Payer: Self-pay | Admitting: Oncology

## 2023-01-17 ENCOUNTER — Inpatient Hospital Stay: Payer: Medicare Other | Attending: Oncology | Admitting: Oncology

## 2023-01-17 VITALS — BP 129/80 | HR 80 | Temp 98.1°F | Resp 18 | Ht 61.0 in | Wt 119.0 lb

## 2023-01-17 DIAGNOSIS — J90 Pleural effusion, not elsewhere classified: Secondary | ICD-10-CM | POA: Diagnosis not present

## 2023-01-17 DIAGNOSIS — I2510 Atherosclerotic heart disease of native coronary artery without angina pectoris: Secondary | ICD-10-CM | POA: Insufficient documentation

## 2023-01-17 DIAGNOSIS — Z8041 Family history of malignant neoplasm of ovary: Secondary | ICD-10-CM | POA: Diagnosis not present

## 2023-01-17 DIAGNOSIS — C778 Secondary and unspecified malignant neoplasm of lymph nodes of multiple regions: Secondary | ICD-10-CM | POA: Insufficient documentation

## 2023-01-17 DIAGNOSIS — Z803 Family history of malignant neoplasm of breast: Secondary | ICD-10-CM | POA: Insufficient documentation

## 2023-01-17 DIAGNOSIS — N63 Unspecified lump in unspecified breast: Secondary | ICD-10-CM

## 2023-01-17 DIAGNOSIS — Z800 Family history of malignant neoplasm of digestive organs: Secondary | ICD-10-CM | POA: Diagnosis not present

## 2023-01-17 DIAGNOSIS — C7951 Secondary malignant neoplasm of bone: Secondary | ICD-10-CM | POA: Insufficient documentation

## 2023-01-17 DIAGNOSIS — Z9049 Acquired absence of other specified parts of digestive tract: Secondary | ICD-10-CM | POA: Diagnosis not present

## 2023-01-17 DIAGNOSIS — Z171 Estrogen receptor negative status [ER-]: Secondary | ICD-10-CM | POA: Diagnosis not present

## 2023-01-17 DIAGNOSIS — C50912 Malignant neoplasm of unspecified site of left female breast: Secondary | ICD-10-CM | POA: Insufficient documentation

## 2023-01-17 DIAGNOSIS — R918 Other nonspecific abnormal finding of lung field: Secondary | ICD-10-CM | POA: Diagnosis not present

## 2023-01-17 LAB — CBC WITH DIFFERENTIAL (CANCER CENTER ONLY)
Abs Immature Granulocytes: 0.05 10*3/uL (ref 0.00–0.07)
Basophils Absolute: 0.1 10*3/uL (ref 0.0–0.1)
Basophils Relative: 1 %
Eosinophils Absolute: 0.5 10*3/uL (ref 0.0–0.5)
Eosinophils Relative: 9 %
HCT: 38.4 % (ref 36.0–46.0)
Hemoglobin: 13.1 g/dL (ref 12.0–15.0)
Immature Granulocytes: 1 %
Lymphocytes Relative: 31 %
Lymphs Abs: 1.7 10*3/uL (ref 0.7–4.0)
MCH: 32.8 pg (ref 26.0–34.0)
MCHC: 34.1 g/dL (ref 30.0–36.0)
MCV: 96 fL (ref 80.0–100.0)
Monocytes Absolute: 0.8 10*3/uL (ref 0.1–1.0)
Monocytes Relative: 14 %
Neutro Abs: 2.4 10*3/uL (ref 1.7–7.7)
Neutrophils Relative %: 44 %
Platelet Count: 156 10*3/uL (ref 150–400)
RBC: 4 MIL/uL (ref 3.87–5.11)
RDW: 20.2 % — ABNORMAL HIGH (ref 11.5–15.5)
WBC Count: 5.4 10*3/uL (ref 4.0–10.5)
nRBC: 0 % (ref 0.0–0.2)

## 2023-01-17 LAB — CMP (CANCER CENTER ONLY)
ALT: 7 U/L (ref 0–44)
AST: 20 U/L (ref 15–41)
Albumin: 3.6 g/dL (ref 3.5–5.0)
Alkaline Phosphatase: 115 U/L (ref 38–126)
Anion gap: 7 (ref 5–15)
BUN: 15 mg/dL (ref 8–23)
CO2: 26 mmol/L (ref 22–32)
Calcium: 8.9 mg/dL (ref 8.9–10.3)
Chloride: 106 mmol/L (ref 98–111)
Creatinine: 0.85 mg/dL (ref 0.44–1.00)
GFR, Estimated: >60 mL/min (ref >60)
Glucose, Bld: 98 mg/dL (ref 70–99)
Potassium: 3.8 mmol/L (ref 3.5–5.1)
Sodium: 139 mmol/L (ref 135–145)
Total Bilirubin: 1.4 mg/dL — ABNORMAL HIGH (ref <1.2)
Total Protein: 6.3 g/dL — ABNORMAL LOW (ref 6.5–8.1)

## 2023-01-17 NOTE — Progress Notes (Signed)
[facility] OFFICE PROGRESS NOTE   Diagnosis: Breast cancer  INTERVAL HISTORY:   [name] returns as scheduled.  She is here with her daughter.  She continues capecitabine.  She is scheduled to begin another week of capecitabine 01/18/2023.  No nausea, mouth sores, or diarrhea.  She feels the left breast mass is unchanged.  No new complaint.  Objective:  Vital signs in last 24 hours:  Blood pressure 129/80, pulse 80, temperature 98.1 F (36.7 C), temperature source Temporal, resp. rate 18, height 5\' 1"  (1.549 m), weight 119 lb (54 kg), SpO2 96%.    HEENT: No thrush or ulcers Lymphatics: Firm 0.5-1 cm mobile left axillary node.  No right axillary nodes. Resp: Coarse end inspiratory rhonchi at the posterior chest bilaterally, no respiratory distress Cardio: Regular rate and rhythm GI: No hepatosplenomegaly Vascular: No leg edema Breast: Approximate 4 cm firm mobile ulcerated left breast mass with surrounding cutaneous nodularity.  There is drainage from the central ulcerated portion of the mass Skin: Palms and soles without erythema.  Callus formation at the soles   Lab Results:  Lab Results  Component Value Date   WBC 5.4 01/17/2023   HGB 13.1 01/17/2023   HCT 38.4 01/17/2023   MCV 96.0 01/17/2023   PLT 156 01/17/2023   NEUTROABS 2.4 01/17/2023    CMP  Lab Results  Component Value Date   NA 138 12/18/2022   K 4.5 12/18/2022   CL 104 12/18/2022   CO2 26 12/18/2022   GLUCOSE 99 12/18/2022   BUN 18 12/18/2022   CREATININE 0.80 12/18/2022   CALCIUM 9.8 12/18/2022   PROT 7.0 12/18/2022   ALBUMIN 3.9 12/18/2022   AST 23 12/18/2022   ALT 10 12/18/2022   ALKPHOS 135 (H) 12/18/2022   BILITOT 1.3 (H) 12/18/2022   GFRNONAA >60 12/18/2022   GFRAA 62 (L) 09/06/2012    Medications: I have reviewed the patient's current medications.   Assessment/Plan: Breast cancer  left breast mass, palpable left axillary lymph node-clinical presentation consistent  with breast cancer Punch biopsy of left chest mass 09/26/2022-poorly differentiated adenocarcinoma, CK7 positive, TTF-1, GATA3, and ER negative, tumor infiltrating the dermis with extension to the subcutis and no connection to the overlying epidermis.,  ER 0%, PR 0%, Ki-67 50%, HER2 2+, HER2 negative by FISH CT chest 10/17/2022-left breast mass, enlarged left axillary and mediastinal lymph nodes, asymmetric fullness of the left hilum, mild pulmonary edema and small right greater than left pleural effusions, scattered solid pulmonary nodules, largest 6 mm in the right upper lobe, chronic fractures of the anterior left third and fourth ribs-irregular appearance of left fourth rib concerning for a pathologic fracture PET 10/27/2022-hypermetabolic left breast mass with metastatic left axillary, subpectoral, mediastinal, and hilar lymphadenopathy.  Potential bilateral adrenal metastases.  Widespread bone metastases Xeloda 1000 mg twice daily 7 days on/7 days off beginning 11/23/2022   History of atrial flutter and heart block, pacemaker G4, P3, 1 miscarriage Family history of breast, colon, and ovarian cancer CAD 6.   History of a "leg clot " 7.   Cholecystectomy 8.   "Pleural effusion "noted    Disposition: [name] appears stable.  She is tolerating the capecitabine well.  The breast mass has not changed significantly in size and remains ulcerated.  The mass may be more mobile.  The left axillary lymph node appears slightly smaller.  She will begin another cycle of capecitabine on 01/18/2023.  [name] will return for an office and lab visit  in 1 month.  She and her daughters continue to work on a long-term living arrangement for [name].  They will let us know if she locates to [place] permanently.  We will arrange for oncology follow-up in [place] if she relocates.  Toni Papas, MD  01/17/2023  8:43 AM

## 2023-01-18 ENCOUNTER — Ambulatory Visit: Payer: Medicare Other | Attending: Cardiovascular Disease | Admitting: Cardiovascular Disease

## 2023-01-18 ENCOUNTER — Encounter: Payer: Self-pay | Admitting: Cardiovascular Disease

## 2023-01-18 ENCOUNTER — Telehealth: Payer: Self-pay | Admitting: Dietician

## 2023-01-18 VITALS — BP 108/60 | HR 82 | Ht 61.0 in | Wt 120.4 lb

## 2023-01-18 DIAGNOSIS — I442 Atrioventricular block, complete: Secondary | ICD-10-CM

## 2023-01-18 DIAGNOSIS — I700 Atherosclerosis of aorta: Secondary | ICD-10-CM | POA: Diagnosis not present

## 2023-01-18 DIAGNOSIS — I2510 Atherosclerotic heart disease of native coronary artery without angina pectoris: Secondary | ICD-10-CM | POA: Diagnosis not present

## 2023-01-18 DIAGNOSIS — I4729 Other ventricular tachycardia: Secondary | ICD-10-CM

## 2023-01-18 DIAGNOSIS — I509 Heart failure, unspecified: Secondary | ICD-10-CM | POA: Diagnosis not present

## 2023-01-18 DIAGNOSIS — I4821 Permanent atrial fibrillation: Secondary | ICD-10-CM

## 2023-01-18 DIAGNOSIS — I10 Essential (primary) hypertension: Secondary | ICD-10-CM

## 2023-01-18 DIAGNOSIS — Z87898 Personal history of other specified conditions: Secondary | ICD-10-CM

## 2023-01-18 NOTE — Telephone Encounter (Signed)
Patient screened on MST. First attempt to reach. Patient picked up call, when I tried to introduce myself she said "I can't understand anything you're saying, here's my daughter."  Daughter go on the phone and said "this is not a good time" and terminated the call.  Gennaro Africa, RDN, LDN Registered Dietitian, [facility] Part Time Remote (Usual office hours: Tuesday-Thursday) Cell: [phone]

## 2023-01-18 NOTE — Progress Notes (Signed)
Cardiology Office Note    Date:  01/18/2023   ID:  Toni, Sanders Jul 03, 1922, MRN [id number]  PCP:  Geoffry Paradise, MD  Cardiologist:   Thurmon Fair, MD   Chief Complaint  Patient presents with   Pacemaker Check    History of Present Illness:  Toni Sanders is a 89 y.o. female with complete heart block, dual-chamber permanent pacemaker, systemic hypertension, history of paroxysmal atrial flutter and atrial fibrillation and history of rare nonsustained VT, now with permanent atrial fibrillation.  She has a large left breast malignant tumor with evidence of spread to axillary and mediastinal lymph nodes and is not a candidate for surgical therapy.  She has started chemotherapy with Xeloda.  Her daughter Toni Sanders is with her today and tells me that Toni Sanders will be moving to her other daughter's Toni Sanders) home in [place] where she has better access to care.  Altagracia is clearly ambivalent about the move and would prefer to live in her own home, but there are limited resources for her in [place].  She reports that she is feeling great.  She denies shortness of breath or edema.  She has not had orthopnea or PND or chest pain.  She continues to take a very low-dose of furosemide 20 mg once daily and asked me if she really needs to take that medication.  Pacemaker function is normal.  She has permanent atrial fibrillation with complete heart block and 100% ventricular pacing.  She is pacemaker dependent.  Her dual-chamber permanent pacemaker implanted in 2014 is now programmed VVIR for permanent atrial fibrillation.  Estimated generator longevity is 5 years.  The ventricular lead (Medtronic [id number] implanted in 2010) has normal parameters.  The heart rate histogram distribution is excellent.  There have been no episodes of high ventricular rates (in the past she had occasional nonsustained VT including a lengthy 22 beat episode in November 2021).  Her recent evaluation also demonstrated aortic  atherosclerosis and severe coronary artery calcifications.  She was visiting with her daughters in [place] when she developed what sounds like an episode of paroxysmal nocturnal dyspnea.  She had been breathing a little harder than usual for couple of days and then woke up around 2 or 3:00 in the morning with extreme dyspnea.  She went to the emergency room where she was told that she had a pleural effusion.  She improved quickly after she was administered diuretics.    Xarelto has been stopped due to the left breast bleeding.  She has not had any severe bleeding problems from the medication in the past.  Past Medical History:  Diagnosis Date   Atrial flutter (HCC) 2010   Bradycardia 2010   Complete heart block (HCC) 2010   s/p PPM by Dr Deborah Chalk (MDT) 06/16/08   H/O cardiovascular stress test 09/22/10   no ischemia, EF >60%   LVH (left ventricular hypertrophy) 09/22/10   Echo >55% mild LVH    Osteoporosis     Past Surgical History:  Procedure Laterality Date   CHOLECYSTECTOMY  1994   PACEMAKER GENERATOR CHANGE  09/06/12   new medtronic generator Adapta L secondary to early battery depletion   PACEMAKER GENERATOR CHANGE N/A 09/06/2012   Procedure: PACEMAKER GENERATOR CHANGE;  Surgeon: Thurmon Fair, MD;  Location: [facility]ATH [facility];  Service: Cardiovascular;  Laterality: N/A;   PACEMAKER PLACEMENT  06/16/08   Implantation of dual-chamber Medtronic PPMY by Dr Deborah Chalk    Current Medications: Outpatient Medications Prior to Visit  Medication Sig  Dispense Refill   acetaminophen (TYLENOL) 325 MG tablet Take 650 mg by mouth every 6 (six) hours as needed.     capecitabine (XELODA) 500 MG tablet Take 2 tablets (1,000 mg total) by mouth 2 (two) times daily after a meal. Take for 7 days, then hold for 7 days, then repeat 56 tablet 0   fish oil-omega-3 fatty acids 1000 MG capsule Take 1 g by mouth daily. 1 tab po qd     furosemide (LASIX) 20 MG tablet Take 1 tablet (20 mg total) by mouth daily. 90 tablet  3   magic mouthwash SOLN Take 5 mLs by mouth 4 (four) times daily as needed. 240 mL 1   metoprolol succinate (TOPROL-XL) 50 MG 24 hr tablet TAKE ONE AND ONE-HALF TABLETS DAILY 135 tablet 2   ondansetron (ZOFRAN) 8 MG tablet Take 1 tablet (8 mg total) by mouth every 8 (eight) hours as needed for nausea. (Patient not taking: Reported on 01/18/2023) 30 tablet 1   prochlorperazine (COMPAZINE) 5 MG tablet Take 1-2 tablets (5-10 mg total) by mouth every 6 (six) hours as needed for nausea. May cause sedation (Patient not taking: Reported on 01/18/2023) 60 tablet 1   No facility-administered medications prior to visit.     Allergies:   Anesthetics, amide; Anesthetics, ester; Anesthetics, halogenated; Morphine and codeine; and Percocet [oxycodone-acetaminophen]   Social History   Socioeconomic History   Marital status: Married    Spouse name: Not on file   Number of children: Not on file   Years of education: Not on file   Highest education level: Not on file  Occupational History   Not on file  Tobacco Use   Smoking status: Never   Smokeless tobacco: Never  Substance and Sexual Activity   Alcohol use: No   Drug use: No   Sexual activity: Not on file  Other Topics Concern   Not on file  Social History Narrative   Lives with spouse in [place].   Social Drivers of Corporate investment banker Strain: Not on file  Food Insecurity: No Food Insecurity (10/17/2022)   Hunger Vital Sign    Worried About Running Out of Food in the Last Year: Never true    Ran Out of Food in the Last Year: Never true  Transportation Needs: No Transportation Needs (10/17/2022)   PRAPARE - Administrator, Civil Service (Medical): No    Lack of Transportation (Non-Medical): No  Physical Activity: Not on file  Stress: Not on file  Social Connections: Unknown (06/21/2021)   [facility]   Social Network    Social Network: Not on file     Family History:  The  patient's family history includes Cancer - Ovarian in her maternal grandmother; Cancer - Prostate in her father; Hypertension in her unknown relative; Pneumonia in her paternal grandmother.   ROS:   Please see the history of present illness.    ROS All other systems are reviewed and are negative.   PHYSICAL EXAM:   VS:  BP 108/60 (BP Location: Right Arm, Patient Position: Sitting, Cuff Size: Small)   Pulse 82   Ht 5\' 1"  (1.549 m)   Wt 120 lb 6.4 oz (54.6 kg)   SpO2 96%   BMI 22.75 kg/m      General: Alert, oriented x3, no distress, healthy left subclavian pacemaker site Head: no evidence of trauma, PERRL, EOMI, no exophtalmos or lid lag, no myxedema, no xanthelasma; normal ears,  nose and oropharynx Neck: normal jugular venous pulsations and no hepatojugular reflux; brisk carotid pulses without delay and no carotid bruits Chest: clear to auscultation, no signs of consolidation by percussion or palpation, normal fremitus, symmetrical and full respiratory excursions Cardiovascular: normal position and quality of the apical impulse, regular rhythm, normal first and paradoxically split second heart sounds, no murmurs, rubs or gallops Abdomen: no tenderness or distention, no masses by palpation, no abnormal pulsatility or arterial bruits, normal bowel sounds, no hepatosplenomegaly Extremities: no clubbing, cyanosis or edema; 2+ radial, ulnar and brachial pulses bilaterally; 2+ right femoral, posterior tibial and dorsalis pedis pulses; 2+ left femoral, posterior tibial and dorsalis pedis pulses; no subclavian or femoral bruits Neurological: grossly nonfocal Psych: Normal mood and affect    Wt Readings from Last 3 Encounters:  01/18/23 120 lb 6.4 oz (54.6 kg)  01/17/23 119 lb (54 kg)  12/18/22 124 lb 8 oz (56.5 kg)      Studies/Labs Reviewed:   EKG: Personally reviewed the tracing from 07/17/2022, shows 100% background atrial fibrillation and ventricular pacing.  The QRS duration is 146  ms with expected prolongation of the QTc at 447 ms ASSESSMENT:    No diagnosis found.     PLAN:  In order of problems listed above:  CHF: She had an episode of what sounds like PND a few months ago in [place].  She has not had any further problems with symptoms or signs of congestive heart failure since starting a very low-dose of furosemide daily.  She would like to stop taking this medicine.  Her weight has been very steady at 120 pounds.  Asked her to continue weighing daily and to reduce the furosemide to every other day and watch for months before deciding whether or not she can wean it further.  Also reviewed the importance of a sodium restricted diet, daily weight monitoring, signs and symptoms of heart failure exacerbation such as orthopnea and PND.  She never had the echocardiogram and at this point, considering the severity of her breast malignancy diagnosis, I do not think this will lead to major changes in treatment or further evaluation. Coronary and aortic atherosclerotic calcification: She has never complained of angina pectoris.  The finding of atherosclerosis in this is  patient who is almost 89 years old and is difficult to quantify.  Starting the statin under the current circumstances does not appear to be of great value. CHB: Pacemaker dependent.  There is no escape rhythm when pacing is taken down to 35 bpm. PPM: Normally functioning device.  Dual-chamber device is programmed VVIR for permanent atrial fibrillation with normal function.  She could continue doing the remote downloads if she takes her transmitter with her to [place] and we can review them here. AFib: Permanent arrhythmia.  Completely asymptomatic and rate control is not an issue since she has heart block.  CHADSVasc 4 (age 74, gender, HTN).  The anticoagulant is currently on hold NSVT: These episodes have been very infrequent and asymptomatic (usually no more than once a year), but can be lengthy, including a 22  beat episode that occurred in November 2022.  On beta-blockers primarily for this indication. HTN: Well-controlled, borderline low.  We can reduce the beta-blocker dose if necessary. Vasovagal syncope: Lifelong complaint, but very infrequent.  She has not had the episode of true syncope in over 4 years.  She is aware of the prodrome and is able to abort full-blown syncope by laying down. Breast cancer: palliative Xeloda chemotherapy, tolerated  so far.     Medication Adjustments/Labs and Tests Ordered: Current medicines are reviewed at length with the patient today.  Concerns regarding medicines are outlined above.  Medication changes, Labs and Tests ordered today are listed in the Patient Instructions below. Patient Instructions  Medication Instructions:  No changes *If you need a refill on your cardiac medications before your next appointment, please call your pharmacy*  Follow-Up: At [facility], you and your health needs are our priority.  As part of our continuing mission to provide you with exceptional heart care, we have created designated Provider Care Teams.  These Care Teams include your primary Cardiologist (physician) and Advanced Practice Providers (APPs -  Physician Assistants and Nurse Practitioners) who all work together to provide you with the care you need, when you need it.  We recommend signing up for the patient portal called "MyChart".  Sign up information is provided on this After Visit Summary.  MyChart is used to connect with patients for Virtual Visits (Telemedicine).  Patients are able to view lab/test results, encounter notes, upcoming appointments, etc.  Non-urgent messages can be sent to your provider as well.   To learn more about what you can do with MyChart, go to [email or web address].    Your next appointment:   1 year(s)  Provider:   Thurmon Fair, MD        Signed, Thurmon Fair, MD  01/18/2023 4:55 PM    [facility]  [facility] [address] [place], [place], [place]  [address] Phone: [phone]; Fax: [phone]

## 2023-01-18 NOTE — Patient Instructions (Signed)

## 2023-01-23 ENCOUNTER — Other Ambulatory Visit (HOSPITAL_COMMUNITY): Payer: Self-pay

## 2023-01-24 ENCOUNTER — Telehealth: Payer: Self-pay | Admitting: Dietician

## 2023-01-24 NOTE — Telephone Encounter (Signed)
Patient screened on MST.  Reached out to [place] patient's daughter in [place].  She reports that patient will be moving to [place] but as long as she is able to travel will continue with [facility] appointments.  She reports her mom eats well when it's something she likes. I encouraged a liberalized diet of the holiday season and relayed that nutrition services are provided at no charge and I would be happy to do a remote consult after she follow up with Dr. Truett Perna.  I offered to send contact to both of patients daughters to return call or text to set up a nutrition consult.  Gennaro Africa, RDN, LDN Registered Dietitian, [facility] Part Time Remote (Usual office hours: Tuesday-Thursday) Cell: [phone]

## 2023-02-05 ENCOUNTER — Other Ambulatory Visit: Payer: Self-pay | Admitting: Oncology

## 2023-02-05 ENCOUNTER — Other Ambulatory Visit (HOSPITAL_COMMUNITY): Payer: Self-pay | Admitting: Pharmacy Technician

## 2023-02-05 ENCOUNTER — Other Ambulatory Visit (HOSPITAL_COMMUNITY): Payer: Self-pay

## 2023-02-05 DIAGNOSIS — N63 Unspecified lump in unspecified breast: Secondary | ICD-10-CM

## 2023-02-05 NOTE — Progress Notes (Signed)
Specialty Pharmacy Refill Coordination Note  Toni Sanders is a 89 y.o. female contacted today regarding refills of specialty medication(s) Capecitabine (XELODA)  Spoke with Daughter Toni Sanders  Patient requested Delivery   Delivery date: 02/13/23   Verified address: [address] [place], [place]   Medication will be filled on 02/11/22.   Refill Request sent to MD

## 2023-02-06 ENCOUNTER — Other Ambulatory Visit (HOSPITAL_COMMUNITY): Payer: Self-pay

## 2023-02-06 MED ORDER — CAPECITABINE 500 MG PO TABS
1000.0000 mg | ORAL_TABLET | Freq: Two times a day (BID) | ORAL | 0 refills | Status: DC
  Filled 2023-02-06: qty 56, 28d supply, fill #0

## 2023-02-08 ENCOUNTER — Other Ambulatory Visit (HOSPITAL_COMMUNITY): Payer: Self-pay

## 2023-02-12 ENCOUNTER — Other Ambulatory Visit: Payer: Self-pay

## 2023-02-12 ENCOUNTER — Inpatient Hospital Stay: Payer: Medicare Other | Attending: Oncology

## 2023-02-12 ENCOUNTER — Inpatient Hospital Stay: Payer: Medicare Other | Admitting: Oncology

## 2023-02-12 ENCOUNTER — Encounter (HOSPITAL_BASED_OUTPATIENT_CLINIC_OR_DEPARTMENT_OTHER): Payer: Self-pay

## 2023-02-12 ENCOUNTER — Inpatient Hospital Stay: Payer: Medicare Other

## 2023-02-12 ENCOUNTER — Ambulatory Visit (HOSPITAL_BASED_OUTPATIENT_CLINIC_OR_DEPARTMENT_OTHER)
Admission: RE | Admit: 2023-02-12 | Discharge: 2023-02-12 | Disposition: A | Discharge status: Home / Self Care | Payer: Medicare Other | Source: Ambulatory Visit | Attending: Oncology | Admitting: Oncology

## 2023-02-12 VITALS — BP 131/78 | HR 92 | Temp 98.1°F | Resp 18 | Ht 61.0 in | Wt 119.3 lb

## 2023-02-12 DIAGNOSIS — C50912 Malignant neoplasm of unspecified site of left female breast: Secondary | ICD-10-CM | POA: Diagnosis not present

## 2023-02-12 DIAGNOSIS — N63 Unspecified lump in unspecified breast: Secondary | ICD-10-CM

## 2023-02-12 DIAGNOSIS — Z86718 Personal history of other venous thrombosis and embolism: Secondary | ICD-10-CM | POA: Diagnosis not present

## 2023-02-12 DIAGNOSIS — Z950 Presence of cardiac pacemaker: Secondary | ICD-10-CM | POA: Diagnosis not present

## 2023-02-12 DIAGNOSIS — Z8041 Family history of malignant neoplasm of ovary: Secondary | ICD-10-CM | POA: Diagnosis not present

## 2023-02-12 DIAGNOSIS — J90 Pleural effusion, not elsewhere classified: Secondary | ICD-10-CM | POA: Diagnosis not present

## 2023-02-12 DIAGNOSIS — Z803 Family history of malignant neoplasm of breast: Secondary | ICD-10-CM | POA: Insufficient documentation

## 2023-02-12 DIAGNOSIS — Z23 Encounter for immunization: Secondary | ICD-10-CM | POA: Diagnosis not present

## 2023-02-12 DIAGNOSIS — Z800 Family history of malignant neoplasm of digestive organs: Secondary | ICD-10-CM | POA: Insufficient documentation

## 2023-02-12 DIAGNOSIS — C7951 Secondary malignant neoplasm of bone: Secondary | ICD-10-CM | POA: Diagnosis not present

## 2023-02-12 DIAGNOSIS — I2510 Atherosclerotic heart disease of native coronary artery without angina pectoris: Secondary | ICD-10-CM | POA: Insufficient documentation

## 2023-02-12 DIAGNOSIS — Z171 Estrogen receptor negative status [ER-]: Secondary | ICD-10-CM | POA: Diagnosis not present

## 2023-02-12 DIAGNOSIS — C771 Secondary and unspecified malignant neoplasm of intrathoracic lymph nodes: Secondary | ICD-10-CM | POA: Diagnosis not present

## 2023-02-12 DIAGNOSIS — R918 Other nonspecific abnormal finding of lung field: Secondary | ICD-10-CM | POA: Diagnosis not present

## 2023-02-12 LAB — CMP (CANCER CENTER ONLY)
ALT: 7 U/L (ref 0–44)
AST: 20 U/L (ref 15–41)
Albumin: 3.7 g/dL (ref 3.5–5.0)
Alkaline Phosphatase: 115 U/L (ref 38–126)
Anion gap: 5 (ref 5–15)
BUN: 19 mg/dL (ref 8–23)
CO2: 27 mmol/L (ref 22–32)
Calcium: 8.9 mg/dL (ref 8.9–10.3)
Chloride: 106 mmol/L (ref 98–111)
Creatinine: 0.77 mg/dL (ref 0.44–1.00)
GFR, Estimated: >60 mL/min (ref >60)
Glucose, Bld: 98 mg/dL (ref 70–99)
Potassium: 3.8 mmol/L (ref 3.5–5.1)
Sodium: 138 mmol/L (ref 135–145)
Total Bilirubin: 1.6 mg/dL — ABNORMAL HIGH (ref 0.0–1.2)
Total Protein: 6.4 g/dL — ABNORMAL LOW (ref 6.5–8.1)

## 2023-02-12 LAB — CBC WITH DIFFERENTIAL (CANCER CENTER ONLY)
Abs Immature Granulocytes: 0.06 10*3/uL (ref 0.00–0.07)
Basophils Absolute: 0.1 10*3/uL (ref 0.0–0.1)
Basophils Relative: 1 %
Eosinophils Absolute: 0.1 10*3/uL (ref 0.0–0.5)
Eosinophils Relative: 2 %
HCT: 40.4 % (ref 36.0–46.0)
Hemoglobin: 14 g/dL (ref 12.0–15.0)
Immature Granulocytes: 1 %
Lymphocytes Relative: 19 %
Lymphs Abs: 1.3 10*3/uL (ref 0.7–4.0)
MCH: 34.8 pg — ABNORMAL HIGH (ref 26.0–34.0)
MCHC: 34.7 g/dL (ref 30.0–36.0)
MCV: 100.5 fL — ABNORMAL HIGH (ref 80.0–100.0)
Monocytes Absolute: 0.8 10*3/uL (ref 0.1–1.0)
Monocytes Relative: 11 %
Neutro Abs: 4.5 10*3/uL (ref 1.7–7.7)
Neutrophils Relative %: 66 %
Platelet Count: 156 10*3/uL (ref 150–400)
RBC: 4.02 MIL/uL (ref 3.87–5.11)
RDW: 19.9 % — ABNORMAL HIGH (ref 11.5–15.5)
WBC Count: 6.8 10*3/uL (ref 4.0–10.5)
nRBC: 0 % (ref 0.0–0.2)

## 2023-02-12 MED ORDER — INFLUENZA VAC A&B SURF ANT ADJ 0.5 ML IM SUSY
0.5000 mL | PREFILLED_SYRINGE | Freq: Once | INTRAMUSCULAR | Status: AC
  Administered 2023-02-12: 0.5 mL via INTRAMUSCULAR
  Filled 2023-02-12: qty 0.5

## 2023-02-12 NOTE — Progress Notes (Signed)
 [facility] OFFICE PROGRESS NOTE   Diagnosis: Breast cancer  INTERVAL HISTORY:   Ms. Boise returns as scheduled.  She continues capecitabine .  She is scheduled to begin another week of capecitabine  on 02/16/2023.  No mouth sores, nausea, diarrhea, or hand/foot pain.  No new complaint.  The left breast mass is unchanged, though there is less bleeding.  Objective:  Vital signs in last 24 hours:  Blood pressure 131/78, pulse 92, temperature 98.1 F (36.7 C), temperature source Temporal, resp. rate 18, height 5' 1 (1.549 m), weight 119 lb 4.8 oz (54.1 kg), SpO2 97%.    HEENT: No thrush or ulcers Lymphatics: No cervical, supraclavicular, right axillary nodes.  Firm 1/2-1 cm mobile left axillary node Resp: Lungs clear bilaterally Cardio: Regular rate and rhythm GI: No hepatosplenomegaly Vascular: No leg edema Breast: Ulcerated inferior left breast mass measuring approximately 4 cm in transverse dimension   Lab Results:  Lab Results  Component Value Date   WBC 6.8 02/12/2023   HGB 14.0 02/12/2023   HCT 40.4 02/12/2023   MCV 100.5 (H) 02/12/2023   PLT 156 02/12/2023   NEUTROABS 4.5 02/12/2023    CMP  Lab Results  Component Value Date   NA 139 01/17/2023   K 3.8 01/17/2023   CL 106 01/17/2023   CO2 26 01/17/2023   GLUCOSE 98 01/17/2023   BUN 15 01/17/2023   CREATININE 0.85 01/17/2023   CALCIUM 8.9 01/17/2023   PROT 6.3 (L) 01/17/2023   ALBUMIN 3.6 01/17/2023   AST 20 01/17/2023   ALT 7 01/17/2023   ALKPHOS 115 01/17/2023   BILITOT 1.4 (H) 01/17/2023   GFRNONAA >60 01/17/2023   GFRAA 62 (L) 09/06/2012     Medications: I have reviewed the patient's current medications.   Assessment/Plan:  Breast cancer  left breast mass, palpable left axillary lymph node-clinical presentation consistent with breast cancer Punch biopsy of left chest mass 09/26/2022-poorly differentiated adenocarcinoma, CK7 positive, TTF-1, GATA3, and ER negative, tumor  infiltrating the dermis with extension to the subcutis and no connection to the overlying epidermis.,  ER 0%, PR 0%, Ki-67 50%, HER2 2+, HER2 negative by FISH CT chest 10/17/2022-left breast mass, enlarged left axillary and mediastinal lymph nodes, asymmetric fullness of the left hilum, mild pulmonary edema and small right greater than left pleural effusions, scattered solid pulmonary nodules, largest 6 mm in the right upper lobe, chronic fractures of the anterior left third and fourth ribs-irregular appearance of left fourth rib concerning for a pathologic fracture PET 10/27/2022-hypermetabolic left breast mass with metastatic left axillary, subpectoral, mediastinal, and hilar lymphadenopathy.  Potential bilateral adrenal metastases.  Widespread bone metastases Xeloda  1000 mg twice daily 7 days on/7 days off beginning 11/23/2022   History of atrial flutter and heart block, pacemaker G4, P3, 1 miscarriage Family history of breast, colon, and ovarian cancer CAD 6.   History of a leg clot  7.   Cholecystectomy 8.   Pleural effusion noted     Disposition: Ms. Guillen has metastatic breast cancer.  She has been maintained on capecitabine  since October.  The left breast mass appears unchanged.  A left axillary lymph node appears slightly smaller.  Ms. Gentz will be referred for a restaging chest CT within the next few days.  She plans to relocate to [facility] later this week.  She will be living adjacent to her daughter.  We will make referral to the cancer center in [place].    I will follow-up on results of the restaging  chest CT and forward these to the medical oncologist in [place].  She may be a candidate for continuing capecitabine , palliative left breast radiation, or switching to a different systemic therapy depending on results of the restaging CT.  The left breast mass has not changed significantly since beginning capecitabine .  Ms. Rockhill is not scheduled for a follow-up appointment  in the oncology clinic here.  I am available to see her as needed.  Arley Hof, MD  02/12/2023  11:47 AM

## 2023-02-13 ENCOUNTER — Encounter: Payer: Self-pay | Admitting: *Deleted

## 2023-02-13 ENCOUNTER — Telehealth: Payer: Self-pay | Admitting: *Deleted

## 2023-02-13 NOTE — Telephone Encounter (Signed)
 Error

## 2023-02-13 NOTE — Progress Notes (Signed)
 PATIENT NAVIGATOR PROGRESS NOTE  Name: Toni Sanders Date: 02/13/2023 MRN: [id number]  DOB: 1922/04/30   Reason for visit:  New patient referral to [place]  Comments:  Referral faxed to [facility] in [place] to [phone]    Time spent counseling/coordinating care: 30-45 minutes

## 2023-02-14 ENCOUNTER — Telehealth: Payer: Self-pay | Admitting: *Deleted

## 2023-02-14 NOTE — Telephone Encounter (Signed)
 Daughter, Slater called for results of CT chest done on 1/6. Also asking for status of referral to [facility] in [place]?   Notified daughter that CT has not been read yet and this RN called radiology for a stat read and MD will be made aware when completed. Referral was sent on 02/13/23 and that the referral insurance coordinator called office yesterday for copy of her insurance card and this was sent.

## 2023-02-15 ENCOUNTER — Telehealth: Payer: Self-pay | Admitting: *Deleted

## 2023-02-15 NOTE — Telephone Encounter (Signed)
 Pt has an appt on 02/21/23 at 2 pm with provider Dr Welton Flakes with [facility] to establish care. Will inform Dr Truett Perna

## 2023-02-15 NOTE — Telephone Encounter (Signed)
 LVM with daughter with following CT interpretation of Dr. Cloretta: CTs show no evidence of progressive cancer, chest lymph nodes are smaller, left breast mass and bone lesions are stable.  Gave her phone # for [facility] to call to make her appointment. They have been trying to reach her. Dr. Cloretta will call provider once with report when the provider seeing her is determined.  Patsy with practice said she will call us  w/provider and appointment date. Requested radiology to push over CT image to them. They use PAC system and send to [facility].

## 2023-02-16 ENCOUNTER — Telehealth: Payer: Self-pay | Admitting: *Deleted

## 2023-02-16 NOTE — Telephone Encounter (Signed)
 TC

## 2023-02-21 DIAGNOSIS — C50912 Malignant neoplasm of unspecified site of left female breast: Secondary | ICD-10-CM | POA: Diagnosis not present

## 2023-02-21 DIAGNOSIS — C7951 Secondary malignant neoplasm of bone: Secondary | ICD-10-CM | POA: Diagnosis not present

## 2023-02-28 ENCOUNTER — Other Ambulatory Visit: Payer: Self-pay | Admitting: Oncology

## 2023-02-28 ENCOUNTER — Other Ambulatory Visit: Payer: Self-pay

## 2023-02-28 DIAGNOSIS — N63 Unspecified lump in unspecified breast: Secondary | ICD-10-CM

## 2023-02-28 MED ORDER — CAPECITABINE 500 MG PO TABS
1000.0000 mg | ORAL_TABLET | Freq: Two times a day (BID) | ORAL | 0 refills | Status: DC
  Filled 2023-02-28: qty 56, 28d supply, fill #0

## 2023-02-28 NOTE — Telephone Encounter (Signed)
Patient has transferred care to Dr. Venetia Night in [place], [place]

## 2023-02-28 NOTE — Progress Notes (Signed)
Specialty Pharmacy Refill Coordination Note  Toni Sanders is a 89 y.o. female contacted today regarding refills of specialty medication(s) Capecitabine (XELODA)   Patient requested Delivery   Delivery date: 03/06/23   Verified address: [address] [place], [place]   Medication will be filled on 03/05/23.   Daughter [name] aware UPS may take a couple of days to arrive especially if continued weather concerns.   Patient is now seeing new oncologist in [place] in conjunction with Dr. Myrle Sheng. Requested we send refill request to Dr. Myrle Sheng and if denied we can call her for new oncologist name and send there instead.

## 2023-03-05 ENCOUNTER — Other Ambulatory Visit: Payer: Self-pay

## 2023-03-08 DIAGNOSIS — Z8049 Family history of malignant neoplasm of other genital organs: Secondary | ICD-10-CM | POA: Diagnosis not present

## 2023-03-08 DIAGNOSIS — Z800 Family history of malignant neoplasm of digestive organs: Secondary | ICD-10-CM | POA: Diagnosis not present

## 2023-03-08 DIAGNOSIS — Z8042 Family history of malignant neoplasm of prostate: Secondary | ICD-10-CM | POA: Diagnosis not present

## 2023-03-08 DIAGNOSIS — Z95810 Presence of automatic (implantable) cardiac defibrillator: Secondary | ICD-10-CM | POA: Diagnosis not present

## 2023-03-08 DIAGNOSIS — C50812 Malignant neoplasm of overlapping sites of left female breast: Secondary | ICD-10-CM | POA: Diagnosis not present

## 2023-03-08 DIAGNOSIS — Z79899 Other long term (current) drug therapy: Secondary | ICD-10-CM | POA: Diagnosis not present

## 2023-03-08 DIAGNOSIS — I2510 Atherosclerotic heart disease of native coronary artery without angina pectoris: Secondary | ICD-10-CM | POA: Diagnosis not present

## 2023-03-08 DIAGNOSIS — Z86711 Personal history of pulmonary embolism: Secondary | ICD-10-CM | POA: Diagnosis not present

## 2023-03-08 DIAGNOSIS — C7951 Secondary malignant neoplasm of bone: Secondary | ICD-10-CM | POA: Diagnosis not present

## 2023-03-08 DIAGNOSIS — I4892 Unspecified atrial flutter: Secondary | ICD-10-CM | POA: Diagnosis not present

## 2023-03-08 DIAGNOSIS — Z510 Encounter for antineoplastic radiation therapy: Secondary | ICD-10-CM | POA: Diagnosis not present

## 2023-03-08 DIAGNOSIS — I4891 Unspecified atrial fibrillation: Secondary | ICD-10-CM | POA: Diagnosis not present

## 2023-03-15 ENCOUNTER — Telehealth: Payer: Self-pay | Admitting: *Deleted

## 2023-03-15 NOTE — Telephone Encounter (Signed)
 Call from daughter requesting Dr. Cloretta to continue to order her Xeloda -has still been doing 1000 mg bid 7 days on/ 7 days off. Will take 1 week break for radiation therapy by Dr. Donzell Odea to her breast/spine x 5 treatments. Due to pharmacy issues, easier to get drug from [facility] and Dr. Liza Bathe is not able to order it from [facility].  Patient still wants to see Dr. Cloretta every couple months as well. Currently have a couple weeks cushion of medication. Requesting appointment early March if possible.

## 2023-03-16 ENCOUNTER — Telehealth: Payer: Self-pay | Admitting: *Deleted

## 2023-03-16 DIAGNOSIS — N63 Unspecified lump in unspecified breast: Secondary | ICD-10-CM

## 2023-03-16 NOTE — Telephone Encounter (Signed)
 LVM for sister that Dr. Scherrie Curt agrees to continue to prescribe her xeloda  as long as he sees her every 3 weeks with labs. Will arrange for OV 1st week March.

## 2023-03-19 ENCOUNTER — Telehealth: Payer: Self-pay | Admitting: Oncology

## 2023-03-19 DIAGNOSIS — C50912 Malignant neoplasm of unspecified site of left female breast: Secondary | ICD-10-CM | POA: Diagnosis not present

## 2023-03-19 DIAGNOSIS — C7951 Secondary malignant neoplasm of bone: Secondary | ICD-10-CM | POA: Diagnosis not present

## 2023-03-19 NOTE — Telephone Encounter (Signed)
 Left patient sister a vm regarding upcoming appointment

## 2023-03-28 ENCOUNTER — Other Ambulatory Visit: Payer: Self-pay

## 2023-03-28 NOTE — Progress Notes (Signed)
Disenrolling - per daughter [name] patient has changed providers and is getting medication through pharmacy in [place].

## 2023-03-30 DIAGNOSIS — Z86711 Personal history of pulmonary embolism: Secondary | ICD-10-CM | POA: Diagnosis not present

## 2023-03-30 DIAGNOSIS — I4892 Unspecified atrial flutter: Secondary | ICD-10-CM | POA: Diagnosis not present

## 2023-03-30 DIAGNOSIS — Z79899 Other long term (current) drug therapy: Secondary | ICD-10-CM | POA: Diagnosis not present

## 2023-03-30 DIAGNOSIS — Z510 Encounter for antineoplastic radiation therapy: Secondary | ICD-10-CM | POA: Diagnosis not present

## 2023-03-30 DIAGNOSIS — Z95810 Presence of automatic (implantable) cardiac defibrillator: Secondary | ICD-10-CM | POA: Diagnosis not present

## 2023-03-30 DIAGNOSIS — Z8042 Family history of malignant neoplasm of prostate: Secondary | ICD-10-CM | POA: Diagnosis not present

## 2023-03-30 DIAGNOSIS — Z8049 Family history of malignant neoplasm of other genital organs: Secondary | ICD-10-CM | POA: Diagnosis not present

## 2023-03-30 DIAGNOSIS — C50812 Malignant neoplasm of overlapping sites of left female breast: Secondary | ICD-10-CM | POA: Diagnosis not present

## 2023-03-30 DIAGNOSIS — Z800 Family history of malignant neoplasm of digestive organs: Secondary | ICD-10-CM | POA: Diagnosis not present

## 2023-03-30 DIAGNOSIS — I2510 Atherosclerotic heart disease of native coronary artery without angina pectoris: Secondary | ICD-10-CM | POA: Diagnosis not present

## 2023-03-30 DIAGNOSIS — C7951 Secondary malignant neoplasm of bone: Secondary | ICD-10-CM | POA: Diagnosis not present

## 2023-03-30 DIAGNOSIS — I4891 Unspecified atrial fibrillation: Secondary | ICD-10-CM | POA: Diagnosis not present

## 2023-04-03 ENCOUNTER — Telehealth: Payer: Self-pay | Admitting: *Deleted

## 2023-04-03 DIAGNOSIS — C50812 Malignant neoplasm of overlapping sites of left female breast: Secondary | ICD-10-CM | POA: Diagnosis not present

## 2023-04-03 NOTE — Telephone Encounter (Signed)
 LVM requesting to move her 04/12/23 appointments with Dr. Truett Perna to week of 04/23/23-request forwarded to scheduler.

## 2023-04-11 ENCOUNTER — Telehealth: Payer: Self-pay

## 2023-04-11 NOTE — Telephone Encounter (Signed)
 Stephens November from the [facility] of [place] has questions about the patient pacemaker. I let him speak with Leigh, rn.

## 2023-04-11 NOTE — Telephone Encounter (Signed)
 Spoke to [place] about patient and the device with radiation. Inquired if he needed a form completed, he stated he did not. Advised that patients device is on the right side of her chest, she is dependent and would need a magnet. He advised that was all the information he needed.

## 2023-04-12 ENCOUNTER — Other Ambulatory Visit: Payer: Medicare Other

## 2023-04-12 ENCOUNTER — Ambulatory Visit: Payer: Medicare Other | Admitting: Oncology

## 2023-04-12 DIAGNOSIS — Z8049 Family history of malignant neoplasm of other genital organs: Secondary | ICD-10-CM | POA: Diagnosis not present

## 2023-04-12 DIAGNOSIS — I4892 Unspecified atrial flutter: Secondary | ICD-10-CM | POA: Diagnosis not present

## 2023-04-12 DIAGNOSIS — C50812 Malignant neoplasm of overlapping sites of left female breast: Secondary | ICD-10-CM | POA: Diagnosis not present

## 2023-04-12 DIAGNOSIS — I2510 Atherosclerotic heart disease of native coronary artery without angina pectoris: Secondary | ICD-10-CM | POA: Diagnosis not present

## 2023-04-12 DIAGNOSIS — Z95810 Presence of automatic (implantable) cardiac defibrillator: Secondary | ICD-10-CM | POA: Diagnosis not present

## 2023-04-12 DIAGNOSIS — Z79899 Other long term (current) drug therapy: Secondary | ICD-10-CM | POA: Diagnosis not present

## 2023-04-12 DIAGNOSIS — I4891 Unspecified atrial fibrillation: Secondary | ICD-10-CM | POA: Diagnosis not present

## 2023-04-12 DIAGNOSIS — Z86711 Personal history of pulmonary embolism: Secondary | ICD-10-CM | POA: Diagnosis not present

## 2023-04-12 DIAGNOSIS — Z8042 Family history of malignant neoplasm of prostate: Secondary | ICD-10-CM | POA: Diagnosis not present

## 2023-04-12 DIAGNOSIS — Z510 Encounter for antineoplastic radiation therapy: Secondary | ICD-10-CM | POA: Diagnosis not present

## 2023-04-12 DIAGNOSIS — Z800 Family history of malignant neoplasm of digestive organs: Secondary | ICD-10-CM | POA: Diagnosis not present

## 2023-04-12 DIAGNOSIS — C7951 Secondary malignant neoplasm of bone: Secondary | ICD-10-CM | POA: Diagnosis not present

## 2023-04-16 DIAGNOSIS — Z800 Family history of malignant neoplasm of digestive organs: Secondary | ICD-10-CM | POA: Diagnosis not present

## 2023-04-16 DIAGNOSIS — Z8042 Family history of malignant neoplasm of prostate: Secondary | ICD-10-CM | POA: Diagnosis not present

## 2023-04-16 DIAGNOSIS — I4892 Unspecified atrial flutter: Secondary | ICD-10-CM | POA: Diagnosis not present

## 2023-04-16 DIAGNOSIS — I4891 Unspecified atrial fibrillation: Secondary | ICD-10-CM | POA: Diagnosis not present

## 2023-04-16 DIAGNOSIS — C7951 Secondary malignant neoplasm of bone: Secondary | ICD-10-CM | POA: Diagnosis not present

## 2023-04-16 DIAGNOSIS — Z79899 Other long term (current) drug therapy: Secondary | ICD-10-CM | POA: Diagnosis not present

## 2023-04-16 DIAGNOSIS — Z510 Encounter for antineoplastic radiation therapy: Secondary | ICD-10-CM | POA: Diagnosis not present

## 2023-04-16 DIAGNOSIS — Z8049 Family history of malignant neoplasm of other genital organs: Secondary | ICD-10-CM | POA: Diagnosis not present

## 2023-04-16 DIAGNOSIS — C50812 Malignant neoplasm of overlapping sites of left female breast: Secondary | ICD-10-CM | POA: Diagnosis not present

## 2023-04-16 DIAGNOSIS — I2510 Atherosclerotic heart disease of native coronary artery without angina pectoris: Secondary | ICD-10-CM | POA: Diagnosis not present

## 2023-04-16 DIAGNOSIS — Z95810 Presence of automatic (implantable) cardiac defibrillator: Secondary | ICD-10-CM | POA: Diagnosis not present

## 2023-04-16 DIAGNOSIS — Z86711 Personal history of pulmonary embolism: Secondary | ICD-10-CM | POA: Diagnosis not present

## 2023-04-17 DIAGNOSIS — Z86711 Personal history of pulmonary embolism: Secondary | ICD-10-CM | POA: Diagnosis not present

## 2023-04-17 DIAGNOSIS — C50912 Malignant neoplasm of unspecified site of left female breast: Secondary | ICD-10-CM | POA: Diagnosis not present

## 2023-04-17 DIAGNOSIS — Z8049 Family history of malignant neoplasm of other genital organs: Secondary | ICD-10-CM | POA: Diagnosis not present

## 2023-04-17 DIAGNOSIS — I4892 Unspecified atrial flutter: Secondary | ICD-10-CM | POA: Diagnosis not present

## 2023-04-17 DIAGNOSIS — C7951 Secondary malignant neoplasm of bone: Secondary | ICD-10-CM | POA: Diagnosis not present

## 2023-04-17 DIAGNOSIS — Z95810 Presence of automatic (implantable) cardiac defibrillator: Secondary | ICD-10-CM | POA: Diagnosis not present

## 2023-04-17 DIAGNOSIS — I2510 Atherosclerotic heart disease of native coronary artery without angina pectoris: Secondary | ICD-10-CM | POA: Diagnosis not present

## 2023-04-17 DIAGNOSIS — Z800 Family history of malignant neoplasm of digestive organs: Secondary | ICD-10-CM | POA: Diagnosis not present

## 2023-04-17 DIAGNOSIS — Z8042 Family history of malignant neoplasm of prostate: Secondary | ICD-10-CM | POA: Diagnosis not present

## 2023-04-17 DIAGNOSIS — I4891 Unspecified atrial fibrillation: Secondary | ICD-10-CM | POA: Diagnosis not present

## 2023-04-17 DIAGNOSIS — C50812 Malignant neoplasm of overlapping sites of left female breast: Secondary | ICD-10-CM | POA: Diagnosis not present

## 2023-04-17 DIAGNOSIS — Z510 Encounter for antineoplastic radiation therapy: Secondary | ICD-10-CM | POA: Diagnosis not present

## 2023-04-17 DIAGNOSIS — Z79899 Other long term (current) drug therapy: Secondary | ICD-10-CM | POA: Diagnosis not present

## 2023-04-18 DIAGNOSIS — Z800 Family history of malignant neoplasm of digestive organs: Secondary | ICD-10-CM | POA: Diagnosis not present

## 2023-04-18 DIAGNOSIS — C7951 Secondary malignant neoplasm of bone: Secondary | ICD-10-CM | POA: Diagnosis not present

## 2023-04-18 DIAGNOSIS — C50812 Malignant neoplasm of overlapping sites of left female breast: Secondary | ICD-10-CM | POA: Diagnosis not present

## 2023-04-18 DIAGNOSIS — I2510 Atherosclerotic heart disease of native coronary artery without angina pectoris: Secondary | ICD-10-CM | POA: Diagnosis not present

## 2023-04-18 DIAGNOSIS — Z86711 Personal history of pulmonary embolism: Secondary | ICD-10-CM | POA: Diagnosis not present

## 2023-04-18 DIAGNOSIS — Z510 Encounter for antineoplastic radiation therapy: Secondary | ICD-10-CM | POA: Diagnosis not present

## 2023-04-18 DIAGNOSIS — Z79899 Other long term (current) drug therapy: Secondary | ICD-10-CM | POA: Diagnosis not present

## 2023-04-18 DIAGNOSIS — Z8042 Family history of malignant neoplasm of prostate: Secondary | ICD-10-CM | POA: Diagnosis not present

## 2023-04-18 DIAGNOSIS — Z95810 Presence of automatic (implantable) cardiac defibrillator: Secondary | ICD-10-CM | POA: Diagnosis not present

## 2023-04-18 DIAGNOSIS — I4891 Unspecified atrial fibrillation: Secondary | ICD-10-CM | POA: Diagnosis not present

## 2023-04-18 DIAGNOSIS — I4892 Unspecified atrial flutter: Secondary | ICD-10-CM | POA: Diagnosis not present

## 2023-04-18 DIAGNOSIS — Z8049 Family history of malignant neoplasm of other genital organs: Secondary | ICD-10-CM | POA: Diagnosis not present

## 2023-04-19 DIAGNOSIS — Z800 Family history of malignant neoplasm of digestive organs: Secondary | ICD-10-CM | POA: Diagnosis not present

## 2023-04-19 DIAGNOSIS — Z86711 Personal history of pulmonary embolism: Secondary | ICD-10-CM | POA: Diagnosis not present

## 2023-04-19 DIAGNOSIS — I2510 Atherosclerotic heart disease of native coronary artery without angina pectoris: Secondary | ICD-10-CM | POA: Diagnosis not present

## 2023-04-19 DIAGNOSIS — Z510 Encounter for antineoplastic radiation therapy: Secondary | ICD-10-CM | POA: Diagnosis not present

## 2023-04-19 DIAGNOSIS — C50812 Malignant neoplasm of overlapping sites of left female breast: Secondary | ICD-10-CM | POA: Diagnosis not present

## 2023-04-19 DIAGNOSIS — Z8042 Family history of malignant neoplasm of prostate: Secondary | ICD-10-CM | POA: Diagnosis not present

## 2023-04-19 DIAGNOSIS — Z8049 Family history of malignant neoplasm of other genital organs: Secondary | ICD-10-CM | POA: Diagnosis not present

## 2023-04-19 DIAGNOSIS — I4892 Unspecified atrial flutter: Secondary | ICD-10-CM | POA: Diagnosis not present

## 2023-04-19 DIAGNOSIS — C7951 Secondary malignant neoplasm of bone: Secondary | ICD-10-CM | POA: Diagnosis not present

## 2023-04-19 DIAGNOSIS — Z95810 Presence of automatic (implantable) cardiac defibrillator: Secondary | ICD-10-CM | POA: Diagnosis not present

## 2023-04-19 DIAGNOSIS — I4891 Unspecified atrial fibrillation: Secondary | ICD-10-CM | POA: Diagnosis not present

## 2023-04-19 DIAGNOSIS — Z79899 Other long term (current) drug therapy: Secondary | ICD-10-CM | POA: Diagnosis not present

## 2023-04-20 DIAGNOSIS — C7951 Secondary malignant neoplasm of bone: Secondary | ICD-10-CM | POA: Diagnosis not present

## 2023-04-20 DIAGNOSIS — Z800 Family history of malignant neoplasm of digestive organs: Secondary | ICD-10-CM | POA: Diagnosis not present

## 2023-04-20 DIAGNOSIS — Z8049 Family history of malignant neoplasm of other genital organs: Secondary | ICD-10-CM | POA: Diagnosis not present

## 2023-04-20 DIAGNOSIS — Z510 Encounter for antineoplastic radiation therapy: Secondary | ICD-10-CM | POA: Diagnosis not present

## 2023-04-20 DIAGNOSIS — C50812 Malignant neoplasm of overlapping sites of left female breast: Secondary | ICD-10-CM | POA: Diagnosis not present

## 2023-04-20 DIAGNOSIS — Z95810 Presence of automatic (implantable) cardiac defibrillator: Secondary | ICD-10-CM | POA: Diagnosis not present

## 2023-04-20 DIAGNOSIS — Z8042 Family history of malignant neoplasm of prostate: Secondary | ICD-10-CM | POA: Diagnosis not present

## 2023-04-20 DIAGNOSIS — I2510 Atherosclerotic heart disease of native coronary artery without angina pectoris: Secondary | ICD-10-CM | POA: Diagnosis not present

## 2023-04-20 DIAGNOSIS — I4891 Unspecified atrial fibrillation: Secondary | ICD-10-CM | POA: Diagnosis not present

## 2023-04-20 DIAGNOSIS — Z79899 Other long term (current) drug therapy: Secondary | ICD-10-CM | POA: Diagnosis not present

## 2023-04-20 DIAGNOSIS — Z86711 Personal history of pulmonary embolism: Secondary | ICD-10-CM | POA: Diagnosis not present

## 2023-04-20 DIAGNOSIS — I4892 Unspecified atrial flutter: Secondary | ICD-10-CM | POA: Diagnosis not present

## 2023-05-01 DIAGNOSIS — C50912 Malignant neoplasm of unspecified site of left female breast: Secondary | ICD-10-CM | POA: Diagnosis not present

## 2023-05-01 DIAGNOSIS — C7951 Secondary malignant neoplasm of bone: Secondary | ICD-10-CM | POA: Diagnosis not present

## 2023-05-08 DIAGNOSIS — C50912 Malignant neoplasm of unspecified site of left female breast: Secondary | ICD-10-CM | POA: Diagnosis not present

## 2023-05-08 DIAGNOSIS — L819 Disorder of pigmentation, unspecified: Secondary | ICD-10-CM | POA: Diagnosis not present

## 2023-05-08 DIAGNOSIS — C7951 Secondary malignant neoplasm of bone: Secondary | ICD-10-CM | POA: Diagnosis not present

## 2023-05-08 DIAGNOSIS — I739 Peripheral vascular disease, unspecified: Secondary | ICD-10-CM | POA: Diagnosis not present

## 2023-05-14 DIAGNOSIS — Z0000 Encounter for general adult medical examination without abnormal findings: Secondary | ICD-10-CM | POA: Diagnosis not present

## 2023-05-14 DIAGNOSIS — I130 Hypertensive heart and chronic kidney disease with heart failure and stage 1 through stage 4 chronic kidney disease, or unspecified chronic kidney disease: Secondary | ICD-10-CM | POA: Diagnosis not present

## 2023-05-14 DIAGNOSIS — I129 Hypertensive chronic kidney disease with stage 1 through stage 4 chronic kidney disease, or unspecified chronic kidney disease: Secondary | ICD-10-CM | POA: Diagnosis not present

## 2023-05-14 DIAGNOSIS — Z1339 Encounter for screening examination for other mental health and behavioral disorders: Secondary | ICD-10-CM | POA: Diagnosis not present

## 2023-05-14 DIAGNOSIS — Z1331 Encounter for screening for depression: Secondary | ICD-10-CM | POA: Diagnosis not present

## 2023-05-14 DIAGNOSIS — J449 Chronic obstructive pulmonary disease, unspecified: Secondary | ICD-10-CM | POA: Diagnosis not present

## 2023-05-15 ENCOUNTER — Inpatient Hospital Stay: Payer: Medicare Other | Admitting: Oncology

## 2023-05-15 ENCOUNTER — Inpatient Hospital Stay: Payer: Medicare Other | Attending: Oncology

## 2023-05-15 VITALS — BP 141/62 | HR 84 | Temp 98.2°F | Resp 18 | Ht 61.0 in | Wt 122.1 lb

## 2023-05-15 DIAGNOSIS — Z1731 Human epidermal growth factor receptor 2 positive status: Secondary | ICD-10-CM | POA: Insufficient documentation

## 2023-05-15 DIAGNOSIS — Z171 Estrogen receptor negative status [ER-]: Secondary | ICD-10-CM | POA: Diagnosis not present

## 2023-05-15 DIAGNOSIS — N63 Unspecified lump in unspecified breast: Secondary | ICD-10-CM | POA: Diagnosis not present

## 2023-05-15 DIAGNOSIS — C7951 Secondary malignant neoplasm of bone: Secondary | ICD-10-CM | POA: Diagnosis not present

## 2023-05-15 DIAGNOSIS — Z8041 Family history of malignant neoplasm of ovary: Secondary | ICD-10-CM | POA: Diagnosis not present

## 2023-05-15 DIAGNOSIS — C50912 Malignant neoplasm of unspecified site of left female breast: Secondary | ICD-10-CM | POA: Insufficient documentation

## 2023-05-15 DIAGNOSIS — Z1722 Progesterone receptor negative status: Secondary | ICD-10-CM | POA: Insufficient documentation

## 2023-05-15 DIAGNOSIS — Z923 Personal history of irradiation: Secondary | ICD-10-CM | POA: Diagnosis not present

## 2023-05-15 DIAGNOSIS — Z803 Family history of malignant neoplasm of breast: Secondary | ICD-10-CM | POA: Diagnosis not present

## 2023-05-15 DIAGNOSIS — L819 Disorder of pigmentation, unspecified: Secondary | ICD-10-CM | POA: Diagnosis not present

## 2023-05-15 DIAGNOSIS — Z800 Family history of malignant neoplasm of digestive organs: Secondary | ICD-10-CM | POA: Diagnosis not present

## 2023-05-15 LAB — CMP (CANCER CENTER ONLY)
ALT: 8 U/L (ref 0–44)
AST: 22 U/L (ref 15–41)
Albumin: 3.4 g/dL — ABNORMAL LOW (ref 3.5–5.0)
Alkaline Phosphatase: 156 U/L — ABNORMAL HIGH (ref 38–126)
Anion gap: 8 (ref 5–15)
BUN: 13 mg/dL (ref 8–23)
CO2: 24 mmol/L (ref 22–32)
Calcium: 8.7 mg/dL — ABNORMAL LOW (ref 8.9–10.3)
Chloride: 108 mmol/L (ref 98–111)
Creatinine: 0.73 mg/dL (ref 0.44–1.00)
GFR, Estimated: >60 mL/min (ref >60)
Glucose, Bld: 99 mg/dL (ref 70–99)
Potassium: 3.5 mmol/L (ref 3.5–5.1)
Sodium: 140 mmol/L (ref 135–145)
Total Bilirubin: 2.2 mg/dL — ABNORMAL HIGH (ref 0.0–1.2)
Total Protein: 5.7 g/dL — ABNORMAL LOW (ref 6.5–8.1)

## 2023-05-15 LAB — CBC WITH DIFFERENTIAL (CANCER CENTER ONLY)
Abs Immature Granulocytes: 0.05 10*3/uL (ref 0.00–0.07)
Basophils Absolute: 0 10*3/uL (ref 0.0–0.1)
Basophils Relative: 1 %
Eosinophils Absolute: 0.2 10*3/uL (ref 0.0–0.5)
Eosinophils Relative: 7 %
HCT: 35.8 % — ABNORMAL LOW (ref 36.0–46.0)
Hemoglobin: 12.4 g/dL (ref 12.0–15.0)
Immature Granulocytes: 2 %
Lymphocytes Relative: 15 %
Lymphs Abs: 0.5 10*3/uL — ABNORMAL LOW (ref 0.7–4.0)
MCH: 36.3 pg — ABNORMAL HIGH (ref 26.0–34.0)
MCHC: 34.6 g/dL (ref 30.0–36.0)
MCV: 104.7 fL — ABNORMAL HIGH (ref 80.0–100.0)
Monocytes Absolute: 0.4 10*3/uL (ref 0.1–1.0)
Monocytes Relative: 12 %
Neutro Abs: 2 10*3/uL (ref 1.7–7.7)
Neutrophils Relative %: 63 %
Platelet Count: 75 10*3/uL — ABNORMAL LOW (ref 150–400)
RBC: 3.42 MIL/uL — ABNORMAL LOW (ref 3.87–5.11)
RDW: 17.2 % — ABNORMAL HIGH (ref 11.5–15.5)
WBC Count: 3.2 10*3/uL — ABNORMAL LOW (ref 4.0–10.5)
nRBC: 0 % (ref 0.0–0.2)

## 2023-05-15 NOTE — Progress Notes (Signed)
 [facility] OFFICE PROGRESS NOTE   Diagnosis: Breast cancer  INTERVAL HISTORY:   Ms. Whitehurst returns for a scheduled visit.  She is here with her daughter.  She has been evaluated and treated by medical oncology and radiation oncology in [place].  She completed 5 treatments with radiation to the left breast mass and spine in March.  She developed malaise and hyperpigmentation of the soles following radiation.  She developed a "blister "at the sole.  Xeloda was placed on hold approximately 3 weeks ago.  No mouth sores or diarrhea.  The left breast mass is no longer bleeding.  Ms. Wooding has no new complaint.  Objective:  Vital signs in last 24 hours:  Blood pressure (!) 141/62, pulse 84, temperature 98.2 F (36.8 C), temperature source Temporal, resp. rate 18, height 5\' 1"  (1.549 m), weight 122 lb 1.6 oz (55.4 kg), SpO2 98%.    HEENT: No thrush or ulcers Lymphatics: No left axillary nodes Resp: Lungs clear bilaterally Cardio: Regular rate and rhythm GI: No hepatosplenomegaly, no mass Vascular: No leg edema Breast: Firm approximate 3 cm mass at the inferior left breast/chest wall.  The mass is no longer ulcerated.  No bleeding. Skin: Palms without erythema.  Mild hyperpigmentation of the soles with multiple areas of callus formation.  No skin breakdown.   Lab Results:  Lab Results  Component Value Date   WBC 3.2 (L) 05/15/2023   HGB 12.4 05/15/2023   HCT 35.8 (L) 05/15/2023   MCV 104.7 (H) 05/15/2023   PLT 75 (L) 05/15/2023   NEUTROABS 2.0 05/15/2023    CMP  Lab Results  Component Value Date   NA 140 05/15/2023   K 3.5 05/15/2023   CL 108 05/15/2023   CO2 24 05/15/2023   GLUCOSE 99 05/15/2023   BUN 13 05/15/2023   CREATININE 0.73 05/15/2023   CALCIUM 8.7 (L) 05/15/2023   PROT 5.7 (L) 05/15/2023   ALBUMIN 3.4 (L) 05/15/2023   AST 22 05/15/2023   ALT 8 05/15/2023   ALKPHOS 156 (H) 05/15/2023   BILITOT 2.2 (H) 05/15/2023   GFRNONAA >60 05/15/2023    GFRAA 62 (L) 09/06/2012   Medications: I have reviewed the patient's current medications.   Assessment/Plan: Breast cancer  left breast mass, palpable left axillary lymph node-clinical presentation consistent with breast cancer Punch biopsy of left chest mass 09/26/2022-poorly differentiated adenocarcinoma, CK7 positive, TTF-1, GATA3, and ER negative, tumor infiltrating the dermis with extension to the subcutis and no connection to the overlying epidermis.,  ER 0%, PR 0%, Ki-67 50%, HER2 2+, HER2 negative by FISH CT chest 10/17/2022-left breast mass, enlarged left axillary and mediastinal lymph nodes, asymmetric fullness of the left hilum, mild pulmonary edema and small right greater than left pleural effusions, scattered solid pulmonary nodules, largest 6 mm in the right upper lobe, chronic fractures of the anterior left third and fourth ribs-irregular appearance of left fourth rib concerning for a pathologic fracture PET 10/27/2022-hypermetabolic left breast mass with metastatic left axillary, subpectoral, mediastinal, and hilar lymphadenopathy.  Potential bilateral adrenal metastases.  Widespread bone metastases Xeloda 1000 mg twice daily 7 days on/7 days off beginning 11/23/2022 Left breast radiation March 2025   History of atrial flutter and heart block, pacemaker G4, P3, 1 miscarriage Family history of breast, colon, and ovarian cancer CAD 6.   History of a "leg clot " 7.   Cholecystectomy 8.   "Pleural effusion "noted       Disposition: Ms. Schnieders appears stable.  She  completed a course of palliative radiation to the left breast.  The left breast mass appears slightly smaller with healing of the ulcerated skin.  She has mild hyperpigmentation and callus formation at the soles.  These changes are likely in part related to Xeloda.  She is scheduled to resume Xeloda next week.  We will forward results from today's labs to Dr. Lennette Bihari.  She is scheduled for follow-up at the [facility]  cancer center 05/21/2023.  She appears to have chronic hyperbilirubinemia.  The thrombocytopenia today may be related to radiation or capecitabine.  She is scheduled to undergo a restaging evaluation in [place] later this month.  Ms. Dossantos will continue care under the direction of Dr. Welton Flakes.  She would like to continue follow-up here.  She will return for an office visit in 3 months.  Thornton Papas, MD  05/15/2023  9:33 AM

## 2023-05-16 ENCOUNTER — Telehealth: Payer: Self-pay | Admitting: Oncology

## 2023-05-16 NOTE — Telephone Encounter (Signed)
 Contacted pt to schedule an appt per 05/15/23 LOS. Unable to reach via phone, voicemail was left.    Follow-Up Information  Follow-up disposition: Return for office.  Check out comments: Office 3 months

## 2023-05-21 DIAGNOSIS — C50912 Malignant neoplasm of unspecified site of left female breast: Secondary | ICD-10-CM | POA: Diagnosis not present

## 2023-05-21 DIAGNOSIS — Z08 Encounter for follow-up examination after completed treatment for malignant neoplasm: Secondary | ICD-10-CM | POA: Diagnosis not present

## 2023-05-21 DIAGNOSIS — Z923 Personal history of irradiation: Secondary | ICD-10-CM | POA: Diagnosis not present

## 2023-05-21 DIAGNOSIS — C50812 Malignant neoplasm of overlapping sites of left female breast: Secondary | ICD-10-CM | POA: Diagnosis not present

## 2023-05-21 DIAGNOSIS — C7951 Secondary malignant neoplasm of bone: Secondary | ICD-10-CM | POA: Diagnosis not present

## 2023-05-21 DIAGNOSIS — I739 Peripheral vascular disease, unspecified: Secondary | ICD-10-CM | POA: Diagnosis not present

## 2023-05-29 DIAGNOSIS — H90A22 Sensorineural hearing loss, unilateral, left ear, with restricted hearing on the contralateral side: Secondary | ICD-10-CM | POA: Diagnosis not present

## 2023-05-30 DIAGNOSIS — C44311 Basal cell carcinoma of skin of nose: Secondary | ICD-10-CM | POA: Diagnosis not present

## 2023-05-31 NOTE — Telephone Encounter (Signed)
 Patient has been scheduled. Aware of appt date and time.

## 2023-06-05 DIAGNOSIS — R937 Abnormal findings on diagnostic imaging of other parts of musculoskeletal system: Secondary | ICD-10-CM | POA: Diagnosis not present

## 2023-06-05 DIAGNOSIS — E279 Disorder of adrenal gland, unspecified: Secondary | ICD-10-CM | POA: Diagnosis not present

## 2023-06-05 DIAGNOSIS — Z9049 Acquired absence of other specified parts of digestive tract: Secondary | ICD-10-CM | POA: Diagnosis not present

## 2023-06-05 DIAGNOSIS — I739 Peripheral vascular disease, unspecified: Secondary | ICD-10-CM | POA: Diagnosis not present

## 2023-06-05 DIAGNOSIS — J9811 Atelectasis: Secondary | ICD-10-CM | POA: Diagnosis not present

## 2023-06-05 DIAGNOSIS — C50912 Malignant neoplasm of unspecified site of left female breast: Secondary | ICD-10-CM | POA: Diagnosis not present

## 2023-06-05 DIAGNOSIS — J90 Pleural effusion, not elsewhere classified: Secondary | ICD-10-CM | POA: Diagnosis not present

## 2023-06-12 DIAGNOSIS — C7951 Secondary malignant neoplasm of bone: Secondary | ICD-10-CM | POA: Diagnosis not present

## 2023-06-12 DIAGNOSIS — C50912 Malignant neoplasm of unspecified site of left female breast: Secondary | ICD-10-CM | POA: Diagnosis not present

## 2023-06-12 DIAGNOSIS — I739 Peripheral vascular disease, unspecified: Secondary | ICD-10-CM | POA: Diagnosis not present

## 2023-06-12 DIAGNOSIS — L819 Disorder of pigmentation, unspecified: Secondary | ICD-10-CM | POA: Diagnosis not present

## 2023-06-13 DIAGNOSIS — H401132 Primary open-angle glaucoma, bilateral, moderate stage: Secondary | ICD-10-CM | POA: Diagnosis not present

## 2023-06-18 ENCOUNTER — Other Ambulatory Visit: Payer: Self-pay | Admitting: Cardiovascular Disease

## 2023-07-09 ENCOUNTER — Encounter: Payer: Self-pay | Admitting: Cardiovascular Disease

## 2023-07-09 NOTE — Telephone Encounter (Signed)
 Error

## 2023-07-19 DIAGNOSIS — C50912 Malignant neoplasm of unspecified site of left female breast: Secondary | ICD-10-CM | POA: Diagnosis not present

## 2023-07-19 DIAGNOSIS — C7951 Secondary malignant neoplasm of bone: Secondary | ICD-10-CM | POA: Diagnosis not present

## 2023-07-27 ENCOUNTER — Telehealth: Payer: Self-pay | Admitting: Cardiovascular Disease

## 2023-07-27 MED ORDER — METOPROLOL SUCCINATE ER 50 MG PO TB24
75.0000 mg | ORAL_TABLET | Freq: Every day | ORAL | 1 refills | Status: DC
Start: 2023-07-27 — End: 2023-09-13

## 2023-07-27 NOTE — Telephone Encounter (Signed)
 Pt's medication was sent to pt's pharmacy as requested. Confirmation received.

## 2023-07-27 NOTE — Telephone Encounter (Signed)
*  STAT* If patient is at the pharmacy, call can be transferred to refill team.   1. Which medications need to be refilled? (please list name of each medication and dose if known)   metoprolol  succinate (TOPROL -XL) 50 MG 24 hr tablet   2. Would you like to learn more about the convenience, safety, & potential cost savings by using the [facility]?   3. Are you open to using the [facility] (Type [facility]. ).  4. Which pharmacy/location (including street and city if local pharmacy) is medication to be sent to?  [facility] - [place], [place] - [address]   5. Do they need a 30 day or 90 day supply?   90 day  Daughter Adriana Hopping) stated patient has some medication.

## 2023-08-07 DIAGNOSIS — W19XXXA Unspecified fall, initial encounter: Secondary | ICD-10-CM | POA: Diagnosis not present

## 2023-08-07 DIAGNOSIS — X58XXXA Exposure to other specified factors, initial encounter: Secondary | ICD-10-CM | POA: Diagnosis not present

## 2023-08-07 DIAGNOSIS — Z515 Encounter for palliative care: Secondary | ICD-10-CM | POA: Diagnosis not present

## 2023-08-07 DIAGNOSIS — S42309A Unspecified fracture of shaft of humerus, unspecified arm, initial encounter for closed fracture: Secondary | ICD-10-CM | POA: Diagnosis not present

## 2023-08-07 DIAGNOSIS — S59911A Unspecified injury of right forearm, initial encounter: Secondary | ICD-10-CM | POA: Diagnosis not present

## 2023-08-07 DIAGNOSIS — R9389 Abnormal findings on diagnostic imaging of other specified body structures: Secondary | ICD-10-CM | POA: Diagnosis not present

## 2023-08-07 DIAGNOSIS — S4991XA Unspecified injury of right shoulder and upper arm, initial encounter: Secondary | ICD-10-CM | POA: Diagnosis not present

## 2023-08-07 DIAGNOSIS — G893 Neoplasm related pain (acute) (chronic): Secondary | ICD-10-CM | POA: Diagnosis not present

## 2023-08-07 DIAGNOSIS — S42391A Other fracture of shaft of right humerus, initial encounter for closed fracture: Secondary | ICD-10-CM | POA: Diagnosis not present

## 2023-08-08 DIAGNOSIS — Z515 Encounter for palliative care: Secondary | ICD-10-CM | POA: Diagnosis not present

## 2023-08-08 DIAGNOSIS — C50919 Malignant neoplasm of unspecified site of unspecified female breast: Secondary | ICD-10-CM | POA: Diagnosis not present

## 2023-08-08 DIAGNOSIS — I4891 Unspecified atrial fibrillation: Secondary | ICD-10-CM | POA: Diagnosis not present

## 2023-08-08 DIAGNOSIS — S42309A Unspecified fracture of shaft of humerus, unspecified arm, initial encounter for closed fracture: Secondary | ICD-10-CM | POA: Diagnosis not present

## 2023-08-08 DIAGNOSIS — G893 Neoplasm related pain (acute) (chronic): Secondary | ICD-10-CM | POA: Diagnosis not present

## 2023-08-09 DIAGNOSIS — C50919 Malignant neoplasm of unspecified site of unspecified female breast: Secondary | ICD-10-CM | POA: Diagnosis not present

## 2023-08-09 DIAGNOSIS — I4891 Unspecified atrial fibrillation: Secondary | ICD-10-CM | POA: Diagnosis not present

## 2023-08-09 DIAGNOSIS — S42309A Unspecified fracture of shaft of humerus, unspecified arm, initial encounter for closed fracture: Secondary | ICD-10-CM | POA: Diagnosis not present

## 2023-08-09 DIAGNOSIS — G893 Neoplasm related pain (acute) (chronic): Secondary | ICD-10-CM | POA: Diagnosis not present

## 2023-08-09 DIAGNOSIS — S42211A Unspecified displaced fracture of surgical neck of right humerus, initial encounter for closed fracture: Secondary | ICD-10-CM | POA: Diagnosis not present

## 2023-08-09 DIAGNOSIS — M1611 Unilateral primary osteoarthritis, right hip: Secondary | ICD-10-CM | POA: Diagnosis not present

## 2023-08-10 DIAGNOSIS — I4891 Unspecified atrial fibrillation: Secondary | ICD-10-CM | POA: Diagnosis not present

## 2023-08-10 DIAGNOSIS — S42309A Unspecified fracture of shaft of humerus, unspecified arm, initial encounter for closed fracture: Secondary | ICD-10-CM | POA: Diagnosis not present

## 2023-08-10 DIAGNOSIS — G893 Neoplasm related pain (acute) (chronic): Secondary | ICD-10-CM | POA: Diagnosis not present

## 2023-08-10 DIAGNOSIS — C50919 Malignant neoplasm of unspecified site of unspecified female breast: Secondary | ICD-10-CM | POA: Diagnosis not present

## 2023-08-11 DIAGNOSIS — I4891 Unspecified atrial fibrillation: Secondary | ICD-10-CM | POA: Diagnosis not present

## 2023-08-11 DIAGNOSIS — C50919 Malignant neoplasm of unspecified site of unspecified female breast: Secondary | ICD-10-CM | POA: Diagnosis not present

## 2023-08-11 DIAGNOSIS — G893 Neoplasm related pain (acute) (chronic): Secondary | ICD-10-CM | POA: Diagnosis not present

## 2023-08-11 DIAGNOSIS — S42309A Unspecified fracture of shaft of humerus, unspecified arm, initial encounter for closed fracture: Secondary | ICD-10-CM | POA: Diagnosis not present

## 2023-08-12 DIAGNOSIS — S42309A Unspecified fracture of shaft of humerus, unspecified arm, initial encounter for closed fracture: Secondary | ICD-10-CM | POA: Diagnosis not present

## 2023-08-12 DIAGNOSIS — C50919 Malignant neoplasm of unspecified site of unspecified female breast: Secondary | ICD-10-CM | POA: Diagnosis not present

## 2023-08-12 DIAGNOSIS — G893 Neoplasm related pain (acute) (chronic): Secondary | ICD-10-CM | POA: Diagnosis not present

## 2023-08-12 DIAGNOSIS — I4891 Unspecified atrial fibrillation: Secondary | ICD-10-CM | POA: Diagnosis not present

## 2023-08-13 ENCOUNTER — Ambulatory Visit: Admitting: Oncology

## 2023-08-13 DIAGNOSIS — C50919 Malignant neoplasm of unspecified site of unspecified female breast: Secondary | ICD-10-CM | POA: Diagnosis not present

## 2023-08-13 DIAGNOSIS — G893 Neoplasm related pain (acute) (chronic): Secondary | ICD-10-CM | POA: Diagnosis not present

## 2023-08-13 DIAGNOSIS — S42309A Unspecified fracture of shaft of humerus, unspecified arm, initial encounter for closed fracture: Secondary | ICD-10-CM | POA: Diagnosis not present

## 2023-08-13 DIAGNOSIS — I4891 Unspecified atrial fibrillation: Secondary | ICD-10-CM | POA: Diagnosis not present

## 2023-08-14 DIAGNOSIS — Z515 Encounter for palliative care: Secondary | ICD-10-CM | POA: Diagnosis not present

## 2023-08-14 DIAGNOSIS — I4891 Unspecified atrial fibrillation: Secondary | ICD-10-CM | POA: Diagnosis not present

## 2023-08-14 DIAGNOSIS — S42391A Other fracture of shaft of right humerus, initial encounter for closed fracture: Secondary | ICD-10-CM | POA: Diagnosis not present

## 2023-08-14 DIAGNOSIS — Z7189 Other specified counseling: Secondary | ICD-10-CM | POA: Diagnosis not present

## 2023-08-14 DIAGNOSIS — C50919 Malignant neoplasm of unspecified site of unspecified female breast: Secondary | ICD-10-CM | POA: Diagnosis not present

## 2023-08-14 DIAGNOSIS — S42309A Unspecified fracture of shaft of humerus, unspecified arm, initial encounter for closed fracture: Secondary | ICD-10-CM | POA: Diagnosis not present

## 2023-08-14 DIAGNOSIS — G893 Neoplasm related pain (acute) (chronic): Secondary | ICD-10-CM | POA: Diagnosis not present

## 2023-08-15 DIAGNOSIS — S42309A Unspecified fracture of shaft of humerus, unspecified arm, initial encounter for closed fracture: Secondary | ICD-10-CM | POA: Diagnosis not present

## 2023-08-15 DIAGNOSIS — C50919 Malignant neoplasm of unspecified site of unspecified female breast: Secondary | ICD-10-CM | POA: Diagnosis not present

## 2023-08-15 DIAGNOSIS — I4891 Unspecified atrial fibrillation: Secondary | ICD-10-CM | POA: Diagnosis not present

## 2023-08-15 DIAGNOSIS — G893 Neoplasm related pain (acute) (chronic): Secondary | ICD-10-CM | POA: Diagnosis not present

## 2023-08-16 DIAGNOSIS — I517 Cardiomegaly: Secondary | ICD-10-CM | POA: Diagnosis not present

## 2023-08-16 DIAGNOSIS — J90 Pleural effusion, not elsewhere classified: Secondary | ICD-10-CM | POA: Diagnosis not present

## 2023-08-16 DIAGNOSIS — J8489 Other specified interstitial pulmonary diseases: Secondary | ICD-10-CM | POA: Diagnosis not present

## 2023-08-17 DIAGNOSIS — C7951 Secondary malignant neoplasm of bone: Secondary | ICD-10-CM | POA: Diagnosis not present

## 2023-08-17 DIAGNOSIS — M8458XS Pathological fracture in neoplastic disease, other specified site, sequela: Secondary | ICD-10-CM | POA: Diagnosis not present

## 2023-08-17 DIAGNOSIS — E559 Vitamin D deficiency, unspecified: Secondary | ICD-10-CM | POA: Diagnosis not present

## 2023-08-17 DIAGNOSIS — C50912 Malignant neoplasm of unspecified site of left female breast: Secondary | ICD-10-CM | POA: Diagnosis not present

## 2023-08-20 DIAGNOSIS — C50919 Malignant neoplasm of unspecified site of unspecified female breast: Secondary | ICD-10-CM | POA: Diagnosis not present

## 2023-08-20 DIAGNOSIS — F419 Anxiety disorder, unspecified: Secondary | ICD-10-CM | POA: Diagnosis not present

## 2023-08-20 DIAGNOSIS — K219 Gastro-esophageal reflux disease without esophagitis: Secondary | ICD-10-CM | POA: Diagnosis not present

## 2023-08-20 DIAGNOSIS — I4891 Unspecified atrial fibrillation: Secondary | ICD-10-CM | POA: Diagnosis not present

## 2023-08-21 DIAGNOSIS — C50919 Malignant neoplasm of unspecified site of unspecified female breast: Secondary | ICD-10-CM | POA: Diagnosis not present

## 2023-08-21 DIAGNOSIS — I4891 Unspecified atrial fibrillation: Secondary | ICD-10-CM | POA: Diagnosis not present

## 2023-08-21 DIAGNOSIS — G893 Neoplasm related pain (acute) (chronic): Secondary | ICD-10-CM | POA: Diagnosis not present

## 2023-08-21 DIAGNOSIS — S42309D Unspecified fracture of shaft of humerus, unspecified arm, subsequent encounter for fracture with routine healing: Secondary | ICD-10-CM | POA: Diagnosis not present

## 2023-08-23 DIAGNOSIS — J910 Malignant pleural effusion: Secondary | ICD-10-CM | POA: Diagnosis not present

## 2023-08-23 DIAGNOSIS — Z515 Encounter for palliative care: Secondary | ICD-10-CM | POA: Diagnosis not present

## 2023-08-23 DIAGNOSIS — F411 Generalized anxiety disorder: Secondary | ICD-10-CM | POA: Diagnosis not present

## 2023-08-23 DIAGNOSIS — C7951 Secondary malignant neoplasm of bone: Secondary | ICD-10-CM | POA: Diagnosis not present

## 2023-08-23 DIAGNOSIS — M25552 Pain in left hip: Secondary | ICD-10-CM | POA: Diagnosis not present

## 2023-09-07 DEATH — deceased

## 2023-09-11 ENCOUNTER — Telehealth: Payer: Self-pay | Admitting: Cardiovascular Disease

## 2023-09-11 NOTE — Telephone Encounter (Signed)
 Dr Francyne   Daughter called to let you know pt passed away on 09/24/23.

## 2023-09-11 NOTE — Telephone Encounter (Signed)
 Per Daughter Pt passed away on 2023/09/14.

## 2023-09-11 NOTE — Telephone Encounter (Signed)
 Remote monitoring removed from [employer]. Marked E in [employer]. Future remote apts canceled in [employer].   Routing to Dr. Francyne as fyi.
